# Patient Record
Sex: Male | Born: 1935 | Race: Black or African American | Hispanic: No | Smoking: Former smoker
Health system: Southern US, Community
[De-identification: ages and names within clinical notes are randomized; demographics above are authoritative.]

## PROBLEM LIST (undated history)

## (undated) DIAGNOSIS — K5900 Constipation, unspecified: Secondary | ICD-10-CM

## (undated) DIAGNOSIS — F3289 Other specified depressive episodes: Secondary | ICD-10-CM

## (undated) DIAGNOSIS — F0280 Dementia in other diseases classified elsewhere without behavioral disturbance: Secondary | ICD-10-CM

## (undated) DIAGNOSIS — S85909A Unspecified injury of unspecified blood vessel at lower leg level, unspecified leg, initial encounter: Secondary | ICD-10-CM

## (undated) DIAGNOSIS — E539 Vitamin B deficiency, unspecified: Secondary | ICD-10-CM

## (undated) DIAGNOSIS — G309 Alzheimer's disease, unspecified: Secondary | ICD-10-CM

## (undated) DIAGNOSIS — C61 Malignant neoplasm of prostate: Secondary | ICD-10-CM

## (undated) DIAGNOSIS — R339 Retention of urine, unspecified: Secondary | ICD-10-CM

## (undated) DIAGNOSIS — F329 Major depressive disorder, single episode, unspecified: Secondary | ICD-10-CM

## (undated) DIAGNOSIS — L8990 Pressure ulcer of unspecified site, unspecified stage: Secondary | ICD-10-CM

## (undated) DIAGNOSIS — I739 Peripheral vascular disease, unspecified: Secondary | ICD-10-CM

## (undated) DIAGNOSIS — D649 Anemia, unspecified: Secondary | ICD-10-CM

## (undated) DIAGNOSIS — J984 Other disorders of lung: Secondary | ICD-10-CM

## (undated) DIAGNOSIS — D696 Thrombocytopenia, unspecified: Secondary | ICD-10-CM

## (undated) DIAGNOSIS — IMO0002 Reserved for concepts with insufficient information to code with codable children: Secondary | ICD-10-CM

## (undated) DIAGNOSIS — N189 Chronic kidney disease, unspecified: Secondary | ICD-10-CM

## (undated) DIAGNOSIS — F0390 Unspecified dementia without behavioral disturbance: Secondary | ICD-10-CM

## (undated) DIAGNOSIS — H269 Unspecified cataract: Secondary | ICD-10-CM

## (undated) DIAGNOSIS — R634 Abnormal weight loss: Secondary | ICD-10-CM

## (undated) DIAGNOSIS — N529 Male erectile dysfunction, unspecified: Secondary | ICD-10-CM

## (undated) HISTORY — DX: Alzheimer's disease, unspecified: G30.9

## (undated) HISTORY — DX: Abnormal weight loss: R63.4

## (undated) HISTORY — DX: Other disorders of lung: J98.4

## (undated) HISTORY — DX: Male erectile dysfunction, unspecified: N52.9

## (undated) HISTORY — DX: Retention of urine, unspecified: R33.9

## (undated) HISTORY — DX: Unspecified cataract: H26.9

## (undated) HISTORY — DX: Malignant neoplasm of prostate: C61

## (undated) HISTORY — DX: Major depressive disorder, single episode, unspecified: F32.9

## (undated) HISTORY — DX: Reserved for concepts with insufficient information to code with codable children: IMO0002

## (undated) HISTORY — DX: Other specified depressive episodes: F32.89

## (undated) HISTORY — DX: Anemia, unspecified: D64.9

## (undated) HISTORY — DX: Thrombocytopenia, unspecified: D69.6

## (undated) HISTORY — DX: Unspecified injury of unspecified blood vessel at lower leg level, unspecified leg, initial encounter: S85.909A

## (undated) HISTORY — DX: Vitamin B deficiency, unspecified: E53.9

## (undated) HISTORY — DX: Constipation, unspecified: K59.00

## (undated) HISTORY — DX: Pressure ulcer of unspecified site, unspecified stage: L89.90

## (undated) HISTORY — PX: EYE SURGERY: SHX253

## (undated) HISTORY — DX: Chronic kidney disease, unspecified: N18.9

## (undated) HISTORY — DX: Unspecified dementia, unspecified severity, without behavioral disturbance, psychotic disturbance, mood disturbance, and anxiety: F03.90

## (undated) HISTORY — DX: Dementia in other diseases classified elsewhere, unspecified severity, without behavioral disturbance, psychotic disturbance, mood disturbance, and anxiety: F02.80

---

## 2000-05-25 ENCOUNTER — Inpatient Hospital Stay (HOSPITAL_COMMUNITY): Admission: EM | Admit: 2000-05-25 | Discharge: 2000-05-31 | Payer: Self-pay

## 2002-08-31 ENCOUNTER — Inpatient Hospital Stay (HOSPITAL_COMMUNITY): Admission: EM | Admit: 2002-08-31 | Discharge: 2002-09-07 | Payer: Self-pay | Admitting: Psychiatry

## 2003-09-15 ENCOUNTER — Emergency Department (HOSPITAL_COMMUNITY): Admission: EM | Admit: 2003-09-15 | Discharge: 2003-09-15 | Payer: Self-pay | Admitting: Emergency Medicine

## 2005-05-24 ENCOUNTER — Ambulatory Visit: Payer: Self-pay | Admitting: Internal Medicine

## 2005-05-24 ENCOUNTER — Inpatient Hospital Stay (HOSPITAL_COMMUNITY): Admission: EM | Admit: 2005-05-24 | Discharge: 2005-06-05 | Payer: Self-pay | Admitting: *Deleted

## 2005-06-20 ENCOUNTER — Ambulatory Visit: Payer: Self-pay | Admitting: Internal Medicine

## 2005-07-13 ENCOUNTER — Ambulatory Visit: Payer: Self-pay | Admitting: Internal Medicine

## 2006-09-26 ENCOUNTER — Emergency Department (HOSPITAL_COMMUNITY): Admission: EM | Admit: 2006-09-26 | Discharge: 2006-09-27 | Payer: Self-pay | Admitting: Emergency Medicine

## 2006-09-26 ENCOUNTER — Ambulatory Visit: Payer: Self-pay | Admitting: Psychiatry

## 2006-09-27 ENCOUNTER — Inpatient Hospital Stay (HOSPITAL_COMMUNITY): Admission: EM | Admit: 2006-09-27 | Discharge: 2006-09-28 | Payer: Self-pay | Admitting: Psychiatry

## 2007-02-20 ENCOUNTER — Inpatient Hospital Stay (HOSPITAL_COMMUNITY): Admission: EM | Admit: 2007-02-20 | Discharge: 2007-02-22 | Payer: Self-pay | Admitting: Emergency Medicine

## 2009-04-29 ENCOUNTER — Observation Stay (HOSPITAL_COMMUNITY): Admission: EM | Admit: 2009-04-29 | Discharge: 2009-05-04 | Payer: Self-pay | Admitting: Emergency Medicine

## 2010-04-14 ENCOUNTER — Ambulatory Visit (HOSPITAL_COMMUNITY): Admission: RE | Admit: 2010-04-14 | Discharge: 2010-04-14 | Payer: Self-pay | Admitting: Urology

## 2010-05-04 ENCOUNTER — Ambulatory Visit (HOSPITAL_BASED_OUTPATIENT_CLINIC_OR_DEPARTMENT_OTHER): Admission: RE | Admit: 2010-05-04 | Discharge: 2010-05-04 | Payer: Self-pay | Admitting: Urology

## 2010-12-17 DIAGNOSIS — I739 Peripheral vascular disease, unspecified: Secondary | ICD-10-CM

## 2010-12-17 HISTORY — DX: Peripheral vascular disease, unspecified: I73.9

## 2011-03-05 LAB — POCT I-STAT 4, (NA,K, GLUC, HGB,HCT)
Glucose, Bld: 86 mg/dL (ref 70–99)
HCT: 38 % — ABNORMAL LOW (ref 39.0–52.0)
Sodium: 138 mEq/L (ref 135–145)

## 2011-03-06 LAB — POCT I-STAT 4, (NA,K, GLUC, HGB,HCT): Glucose, Bld: 117 mg/dL — ABNORMAL HIGH (ref 70–99)

## 2011-03-24 ENCOUNTER — Emergency Department (HOSPITAL_COMMUNITY): Payer: Medicare Other

## 2011-03-24 ENCOUNTER — Emergency Department (HOSPITAL_COMMUNITY)
Admission: EM | Admit: 2011-03-24 | Discharge: 2011-03-24 | Disposition: A | Discharge status: Home / Self Care | Payer: Medicare Other | Attending: Emergency Medicine | Admitting: Emergency Medicine

## 2011-03-24 DIAGNOSIS — M25559 Pain in unspecified hip: Secondary | ICD-10-CM | POA: Insufficient documentation

## 2011-03-24 DIAGNOSIS — M6250 Muscle wasting and atrophy, not elsewhere classified, unspecified site: Secondary | ICD-10-CM | POA: Insufficient documentation

## 2011-03-24 DIAGNOSIS — Z8546 Personal history of malignant neoplasm of prostate: Secondary | ICD-10-CM | POA: Insufficient documentation

## 2011-03-24 DIAGNOSIS — F0390 Unspecified dementia without behavioral disturbance: Secondary | ICD-10-CM | POA: Insufficient documentation

## 2011-03-24 DIAGNOSIS — Z79899 Other long term (current) drug therapy: Secondary | ICD-10-CM | POA: Insufficient documentation

## 2011-03-24 DIAGNOSIS — M79609 Pain in unspecified limb: Secondary | ICD-10-CM | POA: Insufficient documentation

## 2011-03-24 LAB — BASIC METABOLIC PANEL
Calcium: 8.8 mg/dL (ref 8.4–10.5)
Chloride: 102 mEq/L (ref 96–112)
GFR calc non Af Amer: 56 mL/min — ABNORMAL LOW (ref >60)

## 2011-03-24 LAB — DIFFERENTIAL
Eosinophils Relative: 0 % (ref 0–5)
Lymphocytes Relative: 13 % (ref 12–46)
Lymphs Abs: 1.7 10*3/uL (ref 0.7–4.0)

## 2011-03-24 LAB — POCT CARDIAC MARKERS: Troponin i, poc: <0.05 ng/mL (ref 0.00–0.09)

## 2011-03-24 LAB — CBC
Hemoglobin: 11.8 g/dL — ABNORMAL LOW (ref 13.0–17.0)
MCV: 93.5 fL (ref 78.0–100.0)
Platelets: 132 10*3/uL — ABNORMAL LOW (ref 150–400)
RDW: 15.3 % (ref 11.5–15.5)

## 2011-03-27 LAB — COMPREHENSIVE METABOLIC PANEL
BUN: 24 mg/dL — ABNORMAL HIGH (ref 6–23)
Calcium: 9.1 mg/dL (ref 8.4–10.5)
Creatinine, Ser: 1.35 mg/dL (ref 0.4–1.5)
GFR calc Af Amer: >60 mL/min (ref >60)
Glucose, Bld: 81 mg/dL (ref 70–99)
Sodium: 134 mEq/L — ABNORMAL LOW (ref 135–145)
Total Bilirubin: 0.6 mg/dL (ref 0.3–1.2)

## 2011-03-27 LAB — DIFFERENTIAL
Basophils Relative: 0 % (ref 0–1)
Eosinophils Absolute: 0 10*3/uL (ref 0.0–0.7)
Eosinophils Relative: 1 % (ref 0–5)
Lymphs Abs: 1.3 10*3/uL (ref 0.7–4.0)
Monocytes Absolute: 0.3 10*3/uL (ref 0.1–1.0)
Neutrophils Relative %: 61 % (ref 43–77)

## 2011-03-27 LAB — CARBOXYHEMOGLOBIN
Methemoglobin: 1.1 % (ref 0.0–1.5)
O2 Saturation: 62.5 %
Total hemoglobin: 11.8 g/dL — ABNORMAL LOW (ref 13.5–18.0)

## 2011-03-27 LAB — CBC
HCT: 31.8 % — ABNORMAL LOW (ref 39.0–52.0)
Hemoglobin: 11 g/dL — ABNORMAL LOW (ref 13.0–17.0)
MCHC: 33.7 g/dL (ref 30.0–36.0)
MCHC: 33.9 g/dL (ref 30.0–36.0)
MCV: 96.2 fL (ref 78.0–100.0)
MCV: 96.9 fL (ref 78.0–100.0)
Platelets: 126 10*3/uL — ABNORMAL LOW (ref 150–400)
RDW: 14 % (ref 11.5–15.5)
RDW: 14.2 % (ref 11.5–15.5)

## 2011-03-27 LAB — BASIC METABOLIC PANEL
CO2: 26 mEq/L (ref 19–32)
Calcium: 8.8 mg/dL (ref 8.4–10.5)
Chloride: 110 mEq/L (ref 96–112)
GFR calc Af Amer: >60 mL/min (ref >60)
Glucose, Bld: 82 mg/dL (ref 70–99)
Potassium: 4.9 mEq/L (ref 3.5–5.1)

## 2011-03-27 LAB — URINE CULTURE: Colony Count: >=100000

## 2011-03-27 LAB — URINE MICROSCOPIC-ADD ON

## 2011-03-27 LAB — URINALYSIS, ROUTINE W REFLEX MICROSCOPIC
Bilirubin Urine: NEGATIVE
Glucose, UA: NEGATIVE mg/dL
Ketones, ur: 15 mg/dL — AB
Protein, ur: NEGATIVE mg/dL
pH: 6 (ref 5.0–8.0)

## 2011-03-27 LAB — TSH: TSH: 1.277 u[IU]/mL (ref 0.350–4.500)

## 2011-03-27 LAB — ETHANOL: Alcohol, Ethyl (B): <5 mg/dL (ref 0–10)

## 2011-03-27 LAB — VITAMIN B12: Vitamin B-12: 332 pg/mL (ref 211–911)
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
## 2011-05-04 NOTE — Discharge Summary (Signed)
NAME:  [name], [name] NO.:  [id number]   MEDICAL RECORD NO.:  [id number]                   PATIENT TYPE:  IPS   LOCATION:  0501                                 [facility]ACILITY[facility]  [facility]   PHYSICIAN:  Jeanice Lim, M.D.              DATE OF BIRTH:  21-Jan-1926   DATE OF ADMISSION:  08/31/2002  DATE OF DISCHARGE:  09/07/2002                                 DISCHARGE SUMMARY   IDENTIFYING DATA:  This is a 77 year old African-American male, single,  voluntarily admitted.  Had been drinking since being discharged from the [facility], reporting also using cocaine, up to $100 a day.  The patient had  been followed by Dr. Patsi Sears for prostate cancer in the past.   ADMISSION MEDICATIONS:  Lactaid, trazodone.   ALLERGIES:  No known drug allergies.   PHYSICAL EXAMINATION:  Essentially within normal limits, neurologically  nonfocal.   ROUTINE ADMISSION LABS:  MCV was 99.7, otherwise essentially within normal  limits, including liver function tests.   MENTAL STATUS EXAM:  Thin, muscular, generally healthy appearing African-  American male in no acute distress, fully alert.  Speech within normal  limits, mood euthymic, thought process goal directed, thought content  negative for dangerous ideations or psychotic symptoms.  Cognitively intact.  Judgment and insight fair.   ADMISSION DIAGNOSES:   AXIS I:  1. Substance-induced mood disorder.  2. Alcohol dependence.  3. Cocaine abuse.  4. Amphetamine abuse.   AXIS II:  None.   AXIS III:  Lactose intolerance, prostate cancer by history.   AXIS IV:  Moderate, limited support system.   AXIS V:  40/68.   HOSPITAL COURSE:  The patient was admitted and ordered routine p.r.n.  medications, underwent further monitoring, and was encouraged to participate  in individual, group and milieu therapy, including substance abuse therapy  and treatment.  The patient was placed on a low-dose Librium detox protocol  with  monitoring for safety, and patient participated in groups, showed  appropriate behavior on the unit, tolerated detox and medication changes as  well as clinical intervention without difficulty and reported a positive  response.   CONDITION ON DISCHARGE:  Markedly improved.  Mood was euthymic, affect  brighter, thought process goal directed.  Thought content negative for  dangerous ideation or psychotic symptoms.  The patient reported motivation  to be compliant with aftercare planning as well as remaining sober.   DISCHARGE MEDICATIONS:  Trazodone 50 mg q.h.s.   DISPOSITION:  He was to attend AA meetings and follow up in [place].   DISCHARGE DIAGNOSES:   AXIS I:  1. Substance-induced mood disorder.  2. Alcohol dependence.  3. Cocaine abuse.  4. Amphetamine abuse.   AXIS II:  None.   AXIS III:  Lactose intolerance, prostate cancer by history.   AXIS IV:  Moderate, limited support system.   AXIS V:  Global assessment of function on discharge was 55.  Jeanice Lim, M.D.    JEM/MEDQ  D:  10/27/2002  T:  10/27/2002  Job:  [id number]
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
## 2011-05-04 NOTE — Discharge Summary (Signed)
NAME:  VARIAN, INNES NO.:  [id number]   MEDICAL RECORD NO.:  [id number]          PATIENT TYPE:  INP   LOCATION:  3003                         FACILITY:  [facility]   PHYSICIAN:  C. Ulyess Mort, M.D.DATE OF BIRTH:  1926/05/10   DATE OF ADMISSION:  05/24/2005  DATE OF DISCHARGE:                                 DISCHARGE SUMMARY   ADDENDUM:  HYPERKALEMIA:  The patient had hyperkalemia; potassium level went  up to 5.4.  At this point, he was on lisinopril, also on Septra, both of  which could cause hyperkalemia.  Also, the patient had acute renal failure  with creatinine level going up to 1.6 at this point.  Due to multiple  factors, he could have related hyperkalemia.  For this reason, we  discontinued Septra, discontinued lisinopril, also monitored him closely,  gave him a dose of sodium Kayexalate 30 g x1; his potassium level came down  to 4.4 the next morning.  We need to monitor his BMET for a couple of more  days to follow up on his potassium level.  We also need to monitor his  creatinine level since acute renal failure was most likely secondary to  dehydration.  Need to encourage the patient to take a lot of fluids at this  time.       SY/MEDQ  D:  06/05/2005  T:  06/05/2005  Job:  [id number]

## 2011-05-04 NOTE — H&P (Signed)
NAME:  [name], [name] NO.:  [id number]   MEDICAL RECORD NO.:  [id number]          PATIENT TYPE:  EMS   LOCATION:  ED                           FACILITY:  [facility]   PHYSICIAN:  Lonia Blood, M.D.      DATE OF BIRTH:  [birth year]   DATE OF ADMISSION:  02/19/2007  DATE OF DISCHARGE:                              HISTORY & PHYSICAL   PRIMARY CARE PHYSICIAN:  Dr. Tildon Husky at the [facility].   PRESENTING COMPLAINT:  Altered mental status.   HISTORY OF PRESENT ILLNESS:  The patient is a 77 year old gentleman with  known history of dementia and alcoholism as well as other substance  abuse who presented with altered mental status.  The patient apparently  has been drinking, and he could not quantify how long.  He is a poor  historian, and no history is currently available.  Also, no family  available. The patient is unable to describe any other thing.  He was  seen in the ER with an alcohol level of 300.  Otherwise, no other  history is available.   PAST MEDICAL HISTORY FROM E-CHART:  1. Prior alcohol intoxication.  2. History of B12 deficiency, with some anemia.  3. History of major depressive disorder.  4. History of cocaine abuse.  5. History of prostate cancer, status post surgery back in 1994.  6. History of lactose intolerance.  7. History of acute renal failure secondary to dehydration and ACE      inhibitor.  8. History of hypertension.  9. Dementia, most likely metabolic.  10.History of urinary tract infection with Klebsiella before.   ALLERGIES:  NO KNOWN DRUG ALLERGIES.   MEDICATIONS:  Unsure of what the patient is taking at the moment.   SOCIAL HISTORY:  Again, the patient is unable to give adequate history.  However, from e-chart, seemed to be a former smoker, known heavy  drinker.  He lives alone.   FAMILY HISTORY:  Also not obtainable.   PHYSICAL EXAMINATION:  VITAL SIGNS:  Temperature 97.2, blood pressure  80/54, pulse 66, respiratory rate  16, saturations 100% on room air.  GENERAL:  He is awake, but not alert or oriented.  He is lethargic.  HEENT:  PERRL.  EOMI.  No visible jaundice.  No pallor.  NECK:  Supple.  No JVD, no lymphadenopathy.  RESPIRATORY:  There is good air entry bilaterally.  No wheezes, no  rales.  CARDIOVASCULAR:  He has S1, S2, no murmurs.  His abdomen is soft,  nontender, with positive bowel sounds.  EXTREMITIES:  Show no edema, cyanosis, or clubbing.   LABORATORIES:  So far showed alcohol level initially of 300, currently  down to 200.  Sodium 139, potassium 4.1, chloride 105, CO2 24, glucose  77, BUN 9, creatinine 1.17, calcium 8.8.  Head CT is currently pending.  EKG is pending.   ASSESSMENT:  Therefore, this is a 77 year old gentleman admitted with  alcohol intoxication.  The patient has had inpatient behavioral health  admission before for the same problem.  The patient is therefore  currently being admitted for alcohol intoxication at  this point.   PLAN:  1. Alcohol intoxication. The patient's altered mental status shows      that it is secondary to severe alcohol intoxication. However, his      vitals are now stable. Initially, he was hypotensive, but currently      blood pressure 108/54, so we will send him to regular floor with      falls and seizure precaution.  I will start him on thiamine and      folate IV.  Also follow with some Ativan as needed.  I will watch      him for DT.  2. History of B12 deficiency.  I will check his B12 level.  If it is      still low, we will resume B12 injections.  3. History of depressive disorder. Not clear what the patient is      taking. Maybe when he is sober or when family members show up, we      will find out what he is taking and resume it.  4. Prior history of substance abuse.  I will check urine drug screen      as part of his complete workup.  Other medical problems seem to be      is currently stable, but we will wait for the patient to be  more      sober and then determine if there are other issues that we need to      address.      Lonia Blood, M.D.  Electronically Signed     LG/MEDQ  D:  02/20/2007  T:  02/20/2007  Job:  [id number]

## 2011-05-04 NOTE — Discharge Summary (Signed)
NAME:  [name] NO.:  [id number]   MEDICAL RECORD NO.:  [id number]          PATIENT TYPE:  INP   LOCATION:  3003                         FACILITY:  [facility]   PHYSICIAN:  C. Ulyess Mort, M.D.DATE OF BIRTH:  Aug 09, 1926   DATE OF ADMISSION:  05/24/2005  DATE OF DISCHARGE:  05/30/2005                                 DISCHARGE SUMMARY   PHYSICIANS:  Family care physician: [facility], [place]   DISCHARGE DIAGNOSES:  1.  Altered mental status with delusions secondary to multifactorial      including B12 insufficiency, thiamine deficiency secondary to alcohol      induced and dehydration and urinary tract infection.  2.  B12 deficiency.  3.  Anemia secondary to B12 deficiency.  4.  Major depressive disorder.  5.  Alcohol dependence.  6.  History of crack cocaine abuse.  7.  Urinary tract infection with Klebsiella pneumonia.  8.  Acute renal failure secondary to dehydration, question ACE inhibitor      induced.  9.  LACTOSE intolerance.  10. History of prostate cancer status post surgery in 1994.  11. History of depression.  12. History of acetaminophen abuse.  13. History of hernia.  14. History of multiple admission for detoxification secondary to alcohol      abuse.  Last admission February 2005 at [facility] in [place].   DISCHARGE MEDICATIONS:  1.  Thiamine 100 mg p.o. daily.  2.  Protonix 40 mg p.o. daily.  3.  Bactrim 1 tablet p.o. b.i.d. x 2 days, last done June 16.  4.  Lactate tablet p.o. t.i.d. q.a.c.  5.  Bupropion 150 mg p.o. b.i.d.  6.  Terazosin 2 mg p.o. q.h.s.  7.  Aricept 5 mg p.o. q.h.s.  8.  Klonopin 1 mg p.o. daily.  9.  Reglan 10 mg p.o. q.a.c., q.h.s.  10. Imodium p.r.n. diarrhea.  11. Multivitamins 1 daily.  12. HCTZ held for dehydration secondary to acute renal insufficiency with      acute dehydration.  13. B12 1000 mcg monthly.   DISPOSITION:  The patient will be transferred to [facility] for further  inpatient  alcohol rehab program.  Francisco would be a voluntary admission.  In  the [facility], need to follow up:  1.  Creatinine level, BMET.  Encourage patient to drink a lot of fluids.  2.  He also needs monthly B12 shots of 1000 mcg.  3.  Continue to monitor carefully for abstinence from alcohol and follow up      on his mental status post hospitalization.   CONSULTATIONS:  Psychiatry, Antonietta Breach, M.D.   PROCEDURES:  1.  CT scan of the head was done at the time of admission which was negative      for intracranial bleeding.  2.  MRI of the brain was done which showed atrophy and small vessel disease.      No acute stroke.  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  Done on 25 May 2005.   HISTORY OF PRESENT ILLNESS:  [name] is a 77 year old African-  American male with past medical history significant for alcohol abuse,  opiate use, and acetaminophen abuse who was brought to the emergency  department by EMS after staff at the [facility] where he resides found  him wandering the hallway naked and trying to climb into the trash shoot.  At that time, he was oriented to person and place only.  According to his  sister, [name] is typically oriented x 4, participates in church social  functions, lives alone, and takes care of himself.  He has evidentially had  problems with declining functional status over the past six months and has  been noticing loss of memory, especially recent and also has been confused  and not even to take care of his for the past six months.   He was last admitted for alcohol abuse at the [facility] November 2003.   PHYSICAL EXAMINATION:  VITAL SIGNS:  Admission vital signs: Pulse 69, blood  pressure 80/44, recheck 111/60, temperature 97.4, respiratory rate 16, O2  saturation 98% on room air.  On examination, the patient was cachectic, slow  to answer questions, follows  one-step commands.  Eyes: Arcus senilis.  Pupils 4 mm bilaterally.  Cataracts bilaterally, minimally reacting to  light.  ENT atraumatic.  Vital signs negative.  Poor dentition.  NECK:  No JVD. No carotid bruit.  LUNGS:  Clear to auscultation bilaterally.  CARDIOVASCULAR:  Regular rate and rhythm.  No murmurs, gallops, or rubs  appreciated.  ABDOMEN:  Soft, hepatomegaly 3 cm below the ribs, nontender.  EXTREMITIES:  No tremor, no edema.  Cool to touch.  No asterixis.  RECTAL:  Poor rectal tone.  Brown stool hemoccult negative.  SKIN:  Dry, fair turgor.  No spider angioma, no jaundice.  NEUROLOGIC:  Motor 5/5 strength throughout.  Oriented to person, place only.  Slight facial droop on the left.  Extraocular movements intact.  No  nystagmus.  Tongue midline.  Deep tendon reflexes decreased.  Babinski  downgoing.  Decreased vibratory sensation, finger-to-nose abnormal.  No  clonus.   ADMISSION LABORATORY DATA:  CBC: Hemoglobin 10.2, WBC 4.9, thrombocytes 194.  BMET: Sodium 138, potassium 3.8, chloride 109, bicarb 22, BUN 18, creatinine  1.6.  Baseline creatinine 1.1, glucose 99, anion gap 7, bilirubin 0.25,  __________ ALT 9, protein 5.6, albumin 3.2, calcium 8.6, phosphorus 390,  magnesium 2.3.  Point-of-care markers negative x 2.  CK 184, CK-MB 6.4,  troponin I 0.01.   CT showed atrophy, no bleeding.   EKG: Normal sinus rhythm at 67 beats per minute, diffusely flat T waves.  Marginal ST elevations in V2, V3. Right bundle branch block.  Poor R wave  progression.  Borderline QRS 120 msec.  Borderline QT prolongation.   UA showed a pH of 6, nitrites negative, leukocyte esterase positive, wbc's  11 to 20, many bacteria.  Urine drug screen positive for benzodiazepines.  Alcohol level was 0.5.  CK 185, MB 6.4, troponin I 0.01.  Point-of-care  markers x 2 negative.   HOSPITAL COURSE:  PROBLEM #1. ALTERED MENTAL STATUS:  Secondary to delirium induced from possible dehydration, thiamine  insufficiency secondary to  alcoholism, also accompanying B12 deficiency.  At the time of the admission  given his age and being an elderly Perry, differential diagnoses were  broad with history of alcohol abuse and his gradual diminution of memory and  confusion these past few months according to  his sister, possibly  differential would be encephalopathy secondary to thiamine deficiency.  Started treatment with thiamine and folate.  Checked his B12 level which was  low.  Treated him with B12 single dose of 1000 mcg with repeat after one  month.  Also checked RPR level given the subacute nature which was  nonreactive.  Also did thyroid level which was within normal limits.  Given  his age and risk factors, possible TIA versus CVA were in differential for  [name] and given MRI which was negative for acute or chronic stroke.  He did not have any fever.  Leukocyte count was within normal limits.  Unlikely sepsis or meningitis, did not even have fever, photophobia, or neck  stiffness.  He had a chest x-ray which was negative for infiltrates.  Urine  was positive for leukocyte esterase.  Started treated with Septra.  Checked  urine for drug screen which was positive for benzodiazepines.  Check for [facility]  was negative.  Checked acetaminophen level, alcohol level which was within  normal limits.  Checked liver function tests to rule out encephalopathy  which were within normal limits.  Checked ammonia level which was also  normal.   PROBLEM #2.  B12 DEFICIENCY:  Treated him with a single dose of 1000 mcg,  repeat monthly 1000 mcg.  Follow Francisco clinically for improvement of  B12  level.  Also checked thiamine level which was slightly high and also methyl  melanoic acid which was within normal limits.  B12 deficiency could not  explain his current symptoms, and methyl melanoic acid level being normal.  Neurologic involvement with underlying B12 deficiency was unlikely.   PROBLEM #3.   MAJOR DEPRESSIVE DISORDER:  According to patient's sister, Mr.  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  Worked as a Forensic scientist custodian later on and since  past years has been in a lot of stress secondary to his social situation at  home.  Has two children who stay out of the town and sister stays nearby in  [place] and visits him occasionally.  She also mentions he has been  drinking quite a lot these past few months and being very confused and  withdrawing from life.  For Francisco, made a psychiatry consult.  Dr. Jeanie Sewer  saw the patient and recommended to continue him on his home medications of  Wellbutrin XR 150 b.i.d.  He is also on Klonopin 1 mg daily.  Also at the  same time, treated for thiamine and B12 deficiency.  At Francisco time, since the  patient needs further monitoring and also detoxification for alcohol abuse, he will be transferred to the [facility] in [place] where he gets his usual  continuity care for further monitoring and care.   PROBLEM #4.  HISTORY OF ALCOHOL ABUSE:  I started him on thiamine and  folate.  Checked his magnesium, repeated, watched him carefully for DTs.  He  did not have any symptoms of DTs or withdrawal during his hospital stay.  Again, he is going to be transferred to the [facility] for further  detoxification.   PROBLEM #5.  HISTORY OF COCAINE ABUSE:  Again, the patient is to go to  detoxification and will be transferred for inpatient detoxification  rehabilitation program.   PROBLEM #6.  HYPOTENSION:  Most likely secondary to dehydration.  The  patient was dehydrated mostly secondary to poor p.o. intake and also  significant  alcohol intake over the past few days and confusion.  Treated  him with IV normal saline bolus and maintain p.o. fluids.  His creatinine  level came down to 1.1 the next day.   PROBLEM #7.  ACUTE RENAL FAILURE SECONDARY TO DEHYDRATION:  Treated him with  fluids.   His creatinine level came down to 1.1.  Later on discontinued IV  fluids and put him on p.o. After we started him on his home medications  Lisinopril 5 mg daily, his creatinine was slightly bumped up to 1.4 the  second day after starting Lisinopril.  The patient has renal artery  stenosis.  At Francisco time, will hold Lisinopril and monitor him carefully with  rechallenge on Lisinopril and continue to monitor creatinine level.   PROBLEM #8.  MARGINAL ELECTROCARDIOGRAM CHANGES:  Since did not have any old  EKG to compare, cardiac enzymes which were negative, will monitor him on  telemetry.  The patient did not have any symptoms or changes throughout his  hospital stay.   PROBLEM #9.  POSITIVE BENZODIAZEPINES IN URINE:  Maintain him on low-dose  Ativan for avoiding possible benzodiazepine withdrawal.  Later put him on  his home medications of Clonazepam 2 mg daily and reduced the dose to 1 mg  daily.   PROBLEM #10.  MEMORY LOSS:  Could be multifactorial including  Alzhiemers  versus age related.  Finally started him on Aricept during his hospital  stay.   DISCHARGE LABORATORY DATA:  BMET: Sodium 138, potassium 4.5, chloride 106,  BUN 17, bicarb 25, creatinine 1.4.  Blood pressure 106/59, heart rate 66,  respiratory rate 22, temperature 97.  Hemoglobin 9.8, WBC 4.     ______________________________  Judie Grieve, MD    ______________________________  C. Ulyess Mort, M.D.    SY/MEDQ  D:  05/30/2005  T:  05/30/2005  Job:  [id number]   cc:   [facility], [facility], [place]

## 2011-05-04 NOTE — H&P (Signed)
[facility]  Patient:    [name], [name]                      MRN: [id number] Adm. Date:  05/25/00 Attending:  Warnell Bureau, M.D. Dictator:   Eduard Roux, NP                   Psychiatric Admission Assessment  IDENTIFYING INFORMATION:  Patient is a 77 year old single African-American male voluntarily admitted secondary to alcohol abuse and dependency and he is requesting detox from alcohol at this time.  HISTORY OF PRESENT ILLNESS:  Patient reports drinking 2-4 40-ounce beers a day plus one bottle of wine everyday to every other day.  Patient also has been smoking crack cocaine on the weekends.  He has no history of DTs or seizures with alcohol cessation.  He is reporting intermittent depression and has recently gone to [facility] for treatment of this.  He is reporting night sweats, decreased sleep, decreased energy and has experienced no appetite and has lost approximately 40 pounds over the past three years. He is denying suicidal ideation and homicidal ideation.  He reportedly thought he was "hearing things" prior to admission.  He denies that at present.  He has his longest period of sobriety from 70 to 1998.  Patient, additionally, fell on 05/24/00 while drinking and received no significant injury.  Patient appears motivated for detox at this time.  PAST PSYCHIATRIC HISTORY:  He has had Willy Eddy in 1970 for alcohol dependency.  He was in [place] inpatient treatment in 1990 for alcohol dependency.  He has been a client of [facility] x 4 months for depression.  He has had previous psychotropic medication trials of Elavil with unknown results and Wellbutrin with positive results.  No other medications could be recalled at this time.  SOCIAL HISTORY:  He is single.  He has two children.  One son who lives in [place] and one daughter who lives in [place].  He has a sister and aunts that live in  [place].  He lives alone at present.  FAMILY HISTORY:  He has an aunt that is schizophrenic.  His mother was an alcoholic and his uncle was an alcoholic.  ALCOHOL AND DRUG HISTORY:  As noted in history of present illness and additionally he has been smoking crack cocaine on the weekends.  MEDICAL PROBLEMS:  He has a history of prostate cancer with surgical intervention in 1994; hernia repair in 1988 and 1990.  MEDICATIONS: 1. Hytrin questionably 5-10 mg q.h.s. 2. Trazodone of unknown dose. 3. Sudafed.  DRUG ALLERGIES:  No known allergies.  PHYSICAL EXAMINATION:  Performed at [facility] Room without significant findings.  He received folic acid and thiamine while in the emergency room.  His blood alcohol level was 64.  His CBC revealed decreased RBCs at 3.94, increased MCV at 101.1.  CMET with decreased STOT at 26, decreased SGPT at 12.  Urine drug screen was negative.  MENTAL STATUS EXAMINATION:  Appearance and behavior disheveled, older African-American male.  He is very polite and cooperative.  His speech is normal rate and tone and relevant.  His mood is depressed.  His affect is somewhat constricted.  Thought processes were logical and coherent.  Patient is able to stay on track.  There is no evidence of psychosis.  As per history of present illness, patient was wondering whether he was experiencing auditory hallucinations.  He is adamantly denying  any auditory, visual or tactile hallucinations at present.  He is negative for suicidal or homicidal ideation. His cognitive function appears to be intact.  He is oriented x 4.  Adequate insight and judgment.  CURRENT DIAGNOSES: Axis I:    1. Substance-induced mood disorder.            2. Alcohol dependence.            3. Cocaine abuse. Axis II:   None. Axis III:  1. Prostate cancer.            2. History of hernia repair. Axis IV:   Moderate related to problems with primary support group. Axis V:    Current GAF  30; highest past year 60.  TREATMENT PLAN AND RECOMMENDATIONS:  We will voluntarily admit patient to [facility] for stabilization and detox from alcohol. Fifteen-minute checks for safety will be provided as all fall precautions. Phenobarbital protocol will be initiated to treat signs and symptoms of withdrawal.  We will initiate Hytrin 5 mg q.h.s., trazodone 50 mg q.h.s. p.r.n.  After patient stabilizes from detox, we will evaluate the role of antidepressants prior to discharge.  Pending laboratory values at the time of this dictation include TSH, T4 and T3.  TENTATIVE LENGTH OF STAY AND DISCHARGE PLANS:  Two to three days with follow-up at [facility]. DD:  05/26/00 TD:  05/26/00 Job: [id number]

## 2011-05-04 NOTE — Discharge Summary (Signed)
NAME:  [name], [name] NO.:  [id number]   MEDICAL RECORD NO.:  [id number]          PATIENT TYPE:  INP   LOCATION:  3003                         FACILITY:  [facility]   PHYSICIAN:  C. Ulyess Mort, M.D.DATE OF BIRTH:  Oct 01, 1926   DATE OF ADMISSION:  05/24/2005  DATE OF DISCHARGE:  05/30/2005                                 DISCHARGE SUMMARY   PRIMARY CARE PHYSICIAN:  [facility], [place], [place]   DISCHARGE DIAGNOSES:  1.  Altered mental status secondary to delirium secondary to B12 deficiency.   Dictation ends here.       SY/MEDQ  D:  05/30/2005  T:  05/30/2005  Job:  [id number]   cc:   [facility], [place], [facility], [place], [place]

## 2011-05-04 NOTE — Consult Note (Signed)
[name], KANADY NO.:  [id number]   MEDICAL RECORD NO.:  [id number]          PATIENT TYPE:  INP   LOCATION:  1415                         FACILITY:  [facility]   PHYSICIAN:  Antonietta Breach, M.D.  DATE OF BIRTH:  1926-09-30   DATE OF CONSULTATION:  02/21/2007  DATE OF DISCHARGE:  02/22/2007                                 CONSULTATION   REQUESTING PHYSICIAN:  Lonia Blood, M.D.   REASON FOR CONSULTATION:  Altered mental status as well as alcohol  dependence.   HISTORY OF PRESENT ILLNESS:  [name] is a 77 year old male  admitted to the [facility] on February 19, 2007 due to changes in  his mental status.  He does have a well-known history of alcoholism.  He  has resumed drinking; for how long he has been drinking alcohol again,  is not clear.  He has not been able to provide an adequate history.  When he presented to the emergency room his alcohol level was 300.   Currently the patient displays poor time orientation.  He also displays  memory dysfunction.  He is not currently displaying any alcohol  withdrawal signs.  He is cooperative with bedside care, and  noncombative.  His mood appears to be generally within normal limits.  He is not showing any thoughts of harming himself or others.  He is not  showing any hallucinations or delusions.  He has been placed on thiamine  100 mg daily, as well as folic acid 1 mg daily, and a multivitamin  daily.  He was placed on Ativan 1 mg daily q.8 h. p.r.n.; he has not  required any p.r.n. Ativan.   PAST PSYCHIATRIC HISTORY:  [name] has undergone at least three  psychiatric admissions, that have involved complications with alcohol  dependence.  His last known psychiatric admission was to the [facility] in October 2007.  He was in alcohol withdrawal  at that time; and he was treated with a Librium withdrawal protocol.  He  has a history of a mood disorder followed by the [facility].  He also  has a history of cocaine abuse.  He required admissions to the [facility] in June 2001 as well as the fall of 2003.   FAMILY PSYCHIATRIC HISTORY:  The patient reportedly has an aunt who is a  schizophrenic, according to the past medical record.  Also the past  medical record reports that the patient's mother was an alcoholic as  well as his uncle.   SOCIAL HISTORY:  [name] is single.  Occupation:  Retired.  He does  have two children; a daughter and a son.  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  Religion is Baptist.  He does not  have any local family currently, other than possibly a sister who lives  in [place].   PSYCHIATRIC HISTORY:  Mr. [name] has experienced intermittent  depression according to the past medical record.  He has undergone  treatment with Elavil in the past.   GENERAL MEDICAL PROBLEMS:  1. B12 deficiency.  2. Prostate cancer status post surgery in 1997.  3. Lactose intolerance.  4. Hypertension.  5. Dementia is also listed in the past medical record   ALLERGIES:  No known drug allergies.   MEDICATIONS:  The MAR is reviewed.  The patient is currently on Ativan 1  mg q.8 h. p.r.n.; and has not required any p.r.n. dosing of Ativan.  He  is also on a multivitamin daily, folic acid 1 mg daily and thiamine 100  mg daily.   LABORATORY DATA:  WBC 4.4, hemoglobin 11.9, platelet count 186.  INR  1.0.  Metabolic panel shows a BUN of 9, creatinine of 1.17, SGOT 24,  SGPT 12, calcium 8.6, albumin is within normal range at 3.5.  TSH within  normal limits at 0.63, B12 is 655 which is within normal limits. Tylenol  negative. Aspirin negative.  Urine drug screen unremarkable.  Alcohol  was 295 upon presentation to the emergency room.   HEAD CT:  Without contrast on March 6 showed generalized atrophy, no  acute intracranial abnormality.   REVIEW OF SYSTEMS:  The patient was not able to provide part  of this.  It was also taken from the medical record and staff.  CONSTITUTIONAL:  Afebrile.  HEAD:  No trauma.  EYES:  No visual changes.  EARS:  No  hearing impairment NOSE:  No rhinorrhea.  MOUTH/THROAT:  No sore throat.  NEUROLOGIC:  As above.  PSYCHIATRIC:  As above.  CARDIOVASCULAR:  No  chest pain, palpitations.  RESPIRATORY:  No coughing nor wheezing.  GASTROINTESTINAL:  No nausea, vomiting, diarrhea.  GENITOURINARY:  No  dysuria.  SKIN:  Unremarkable.  ENDOCRINE/METABOLIC:  As above.  MUSCULOSKELETAL:  No deformities.  HEMATOLOGIC/LYMPHATIC:  Anemia.   PHYSICAL EXAMINATION:  VITAL SIGNS:  Temperature 98.0, pulse 68,  respiration 20, blood pressure 133/66, O2 saturation on room air 100%.  The patient demonstrates no withdrawal tremor or sweating.   MENTAL STATUS EXAM:  [name] is a polite, elderly male appearing his  chronologic age; partially reclined in a supine position in his hospital  bed, alert with good eye contact.  Psychomotor tone is generally within  normal limits.  On orientation testing he believes the year is 91.  He  does not know the month or the day of the month.  He does not know the  day of the week.  He does not know the place.  On memory testing 3/3  immediate, 0/3 at 3 minutes.  His thought process involves partial  confabulation.  Thought content:  No thoughts of harming himself, no  thoughts of harming others, no delusions, no hallucinations.  Fund of  knowledge and intelligence below that of his estimated premorbid  baseline.  His speech involves normal rate and prosody.  Concentration  is decreased as his memory does not allow him to stay on the subject.  Insight is poor.  Judgment is impaired.  Affect is broad and  appropriate.  Mood is within normal limits.   ASSESSMENT:  AXIS I:  1.  (Code 294.9) Unspecified persistent mental  disorder NOS.  The patient does not have a delirium currently.  He does exhibit signs of dementia.  It is unclear  whether this is reversible, or  partially reversible.  Given his level of albumin, it does not appear  that he was having severe malnutrition.  Also, it appears that his B12  level was maintained, therefore, the possibility of B1 deficiency in the  typical nutritional deficient alcoholic lifestyle is not high during  this admission.  However, there is the possibility that with continued  nutrition and B1 replacement that he could recover significant memory  and cognitive function.  1. He also may have a form of Korsakoff dementia or multifactorial      dementia.  2. (Code 293.83) Mood disorder not otherwise specified, depressed,      currently stable (possibly functional as well as general medical      etiologies (this category is utilized to designate the patient's      history of depression reported in the record.  Would not place the      patient on an antidepressant unless he developed depressive      symptoms.  At this point, the patient's history cannot confirm that      he meets the criteria for antidepression prevention.  AXIS II:  None  AXIS III:  See general medical problems.  AXIS IV:  General medical, primary support group.  AXIS V:  40.   RECOMMENDATIONS:  1. Would continue his nutritional supplementation including thiamine      100 mg q.a.m.  2. If his memory and cognitive function do not return, would ensure      that he has had an RPR checked and would consider Aricept and/or      Namenda.      Antonietta Breach, M.D.  Electronically Signed     JW/MEDQ  D:  02/23/2007  T:  02/23/2007  Job:  [id number]   cc:   Lonia Blood, M.D.

## 2011-05-04 NOTE — Discharge Summary (Signed)
[name], OSMENT NO.:  [id number]   MEDICAL RECORD NO.:  [id number]          PATIENT TYPE:  INP   LOCATION:  1415                         FACILITY:  [facility]   PHYSICIAN:  Hettie Holstein, D.O.    DATE OF BIRTH:  [birth year]   DATE OF ADMISSION:  02/19/2007  DATE OF DISCHARGE:  02/22/2007                               DISCHARGE SUMMARY   PRINCIPAL DIAGNOSIS:  Alcohol dependency presenting with acute  intoxication.   SECONDARY DIAGNOSIS:  1. B12 deficiency.  2. Depression.  3. History of cocaine abuse.  4. History of prostate cancer.  5. Resolved hypotension.  6. Reported lactose intolerance.   MEDICATIONS ON DISCHARGE:  Valium 5 mg t.i.d. x2 days followed by b.i.d.  x2 days followed by daily x2 days.   FOLLOW UP:  Instructed to follow-up with primary physician following  discharge.  He is felt to be medically stable for discharge.   He underwent physical therapy, occupational therapy evaluation with  baseline functional status and mobilization using a cane.  Home health  physical therapy recommendations were made for environmental assessment  for fall prevention after discharge and meals on wheels.  Information  was provided to patient and family.  Home health occupational therapy on  follow-up was recommended as well.  These were arranged.  The patient  was seen by Dr. Jeanie Sewer of psychiatry in consultation with  recommendations for continuation of nutritional supplementation  including Thiamine.  His laboratory data revealed a B12 level of 6/55.  His acetaminophen and salicylate levels were within normal range.  His  lipid profile revealed an LDL of 92, total cholesterol 179, HDL 73.  Triglycerides 71.  TSH was 0.633.  PT was 1.0.  Hepatic function panel  was within normal range.  His alcohol level was 200 mm/dc.   He is felt to be medically stable for discharge.      Hettie Holstein, D.O.  Electronically Signed     ESS/MEDQ  D:  03/27/2007  T:   03/27/2007  Job:  [id number]

## 2011-05-04 NOTE — H&P (Signed)
NAME:  [name], [name] NO.:  [id number]   MEDICAL RECORD NO.:  [id number]                   PATIENT TYPE:  IPS   LOCATION:  0501                                 [facility]  [facility]   PHYSICIAN:  Jeanice Lim, MD                DATE OF BIRTH:  1926-04-20   DATE OF ADMISSION:  08/31/2002  DATE OF DISCHARGE:                         PSYCHIATRIC ADMISSION ASSESSMENT   IDENTIFYING INFORMATION:  This is a 77 year old African-American male who is  single.  He is a voluntary admission.  Goes by the name of [name].   HISTORY OF PRESENT ILLNESS:  This patient arrived at the emergency room  accompanied by his sister requesting detox and reporting blackouts and  depressed mood with his alcohol use.  He reports that he relapsed  immediately after being discharged from the [facility] where he spent five weeks in either January or  February of this past year and he reports he has been drinking since that  time in escalating fashion.  He has now been drinking up to five 40-ounce  Cobra beers per day along with some whiskey, almost every day.  He also had  been introduced to cocaine about six years ago by his younger friends that  he drinks with and tends to use cocaine when he is drinking.  The most he  ever uses is about $100 a day.  He reports that his biggest problem is the  need to change his friends, that after he gets detoxed and they knock on his  door and he starts drinking again with them.  He denies any specific other  life stressors.  He denies any homicidal ideation or suicidal ideation  today.  He denies any auditory or visual hallucinations and he admits that  he has been quite depressed when he drinks and he is also having problems  with blackouts, although he denies that he has ever had any suicidal  ideation or thoughts of harming anyone else.  The patient reports that he  sleeps well if he uses his trazodone and that he  blames the alcohol for his  mood.  The patient's urine drug screen was also positive for amphetamines  but he is at a loss to explain this.   PAST PSYCHIATRIC HISTORY:  The patient has been followed at the [facility],  who has prescribed his medications.  He is followed there both for his  medical problems and for his mood disorder.  This is his second admission to  [facility] with his last one in 2001, also for  treatment of alcohol abuse and cocaine abuse.  The patient denies any  suicidal ideation in the past or any attempt.  He reports his longest period  sober was approximately 20 years in the distant past with the help of  Alcoholics Anonymous.  He began using alcohol at age 5 but reports he did  not really have problems with it until his 53s.  The patient does have a  history of other detoxes in the distant past including admissions to  hospitals in [place].   SOCIAL HISTORY:  This is a 77 year old African-American male who is single.  He has two children, one son in [place], one daughter who lives in [place].  He has one supportive sibling who is still living here in [place], his  sister who is helpful to him and who took him to the emergency room  yesterday.  He was in the [place] War.  He currently lives at [facility] in [place].   FAMILY HISTORY:  The patient denies any family history of substance abuse or  mental health problems.   ALCOHOL/DRUG HISTORY:  As noted above.   MEDICAL HISTORY:  The patient has been followed in the distant past by Dr.  Patsi Sears for prostate cancer but more recently by the [facility].  He had  surgical intervention for this in 1994 and no treatment since that time.  He  apparently does have a possible distant history of hypertension but nothing  current.  Denies taking any medications.  Currently, he does have lactose  intolerance.   MEDICATIONS:  LactAid tablets, which he takes to prevent getting  diarrhea  when he wants to drink milk products.  The patient also takes trazodone 50  mg apparently q.h.s.  He is somewhat unclear on the dose.  He takes this at  night to help him sleep.   ALLERGIES:  None.   POSITIVE PHYSICAL FINDINGS:  The patient's physical examination was done in  the emergency room.  Vital signs, on admission to the unit, were temperature  96.7, pulse 72, respirations 16, blood pressure 122/66.  He is 5 feet 9  inches tall and weighs 145 pounds.  He reports his baseline weight of 160  pounds but reports that he is unable to eat when he is using cocaine and he  completely loses his appetite.   LABORATORY DATA:  His pulse oximetry there was 99%.  His alcohol level was  less than 5.  His urine drug screen was positive for cocaine and  amphetamines.  His CBC shows RBC is mildly decreased at 3.60.  His  hemoglobin is at 12.3 and his hematocrit at 35.8.  His MCV is 99.7%.  Metabolic panel revealed electrolytes within normal limits.  His BUN is 19,  creatinine 1.1.  Liver enzymes within normal limits with a total bilirubin  of 0.5.  His SGOT is 22.  His SGPT is 13.  His thyroid panel is currently  pending.   MENTAL STATUS EXAM:  This is a thin, muscular, generally healthy-appearing  African-American male who is in no acute distress.  He is fully alert with a  bright affect, congenial and cooperative.  Speech is normal and relevant.  Mood is euthymic.  Thought process is logical without deficits.  His insight  is very good.  He seems to have good resolve to quit and has formulated a  strategy and is aware of his relapse triggers.  Cognitively, he is intact  and oriented x 3.   DIAGNOSES:   AXIS I:  1. Substance-induced mood disorder.  2. Alcohol abuse; rule out dependence.  3. Cocaine abuse; rule out dependence.  4. Amphetamine abuse.   AXIS II:  None.   AXIS III:  1. Lactose intolerance.  2. Prostate cancer by history. 3. Anemia not otherwise specified.    AXIS  IV:  Deferred.   AXIS V:  Current 40; past year 11.   PLAN:  Voluntarily admit the patient to detox him with a goal of a safe  detox within five days and to evaluate him for possible major depression  based on his mood change and to rule out substance-induced mood disorder.  We will place him on a low dose Librium protocol which, so far, he is  tolerating well and enroll him immediately in intensive group and individual  psychotherapy.  Meanwhile, we will give him trazodone 50 mg p.o. q.h.s. and  will refer him back to the [facility] at discharge for  additional follow-up and getting him hooked up with AA in here to renew his  resolve to stay sober.  Meanwhile, we are going to do anemia studies on him  based  on his high normal mean corpuscular volume and his low hemoglobin and  hematocrit and we will restart his LactAid.   ESTIMATED LENGTH OF STAY:  Five days.     Margaret A. Stephannie Peters                   Jeanice Lim, MD    MAS/MEDQ  D:  09/01/2002  T:  09/01/2002  Job:  [id number]

## 2011-05-04 NOTE — Discharge Summary (Signed)
[name], BOISCLAIR NO.:  [id number]   MEDICAL RECORD NO.:  [id number]          PATIENT TYPE:  IPS   LOCATION:  0307                          [facility]ACILITY[facility]  [facility]   PHYSICIAN:  Anselm Jungling, MD  DATE OF BIRTH:  06/09/1926   DATE OF ADMISSION:  09/27/2006  DATE OF DISCHARGE:  09/28/2006                                 DISCHARGE SUMMARY   IDENTIFYING DATA AND REASON FOR ADMISSION:  The patient is a 77 year old  African American male who presented for treatment of alcohol dependence.  His last drink was 2 days prior to admission.  He had been drinking probably  no more than a 40 ounces beer daily.  He denied withdrawal symptoms upon  admission, but had been given low-dose Librium to alleviate alcohol  cessation symptoms prior to the initial MD assessment.  He had previous  courses of detoxification for alcoholism here in 2004.  Please refer to the  admission note for further details pertaining to the symptoms, circumstances  and history that led to his hospitalization.   The patient also had a history of dementia.  He was given initial Axis I  diagnoses of alcohol dependence, rule out mood disorder NOS, and dementia  NOS.   MEDICAL AND LABORATORY:  The patient was medically and physically assessed  by the psychiatric nurse practitioner.  He presented as [place] slender, but  healthy-appearing well-organized gentleman who walked with a cane, and was  hard of hearing.  Otherwise, he appeared to be in generally good health  without any  chronic medical problems.  There were no significant medical  issues.   HOSPITAL COURSE:  The patient was placed on a Librium withdrawal protocol,  but he had very minimal alcohol withdrawal symptoms, and after his  admission, we learned that his alcohol consumption level had not necessarily  been high enough to produce a true withdrawal syndrome.  He was pleasant,  cooperative, and although he had some memory difficulties, indicated in  a  sincere fashion that he did want to stop drinking.  He did attend  therapeutic groups and activities including AA meetings, and indicated that  he was interested in continuing with AA and getting a sponsor.   We identified the fact that the patient was to have an appointment at the  [facility] on Monday, 09/30/2006, for assessment for  services related to his dementia diagnosis.  It was determined that he could  also explore the possibility of further treatment for alcohol recovery at  that place and time.   He was felt to be appropriate for discharge on 09/28/2006, the third  hospital day.   AFTERCARE:  The patient was to follow-up on 09/30/2006 at the [facility] in [place] for further services including those related to his  cognitive decline and his alcohol dependence.  He was discharged on no  routine medications.  He was to return to the home of his family.   DISCHARGE DIAGNOSES:  AXIS I: Dementia, not otherwise specified, alcohol  abuse, not otherwise specified.  AXIS II: Deferred.  AXIS III: No acute or  chronic illnesses.  AXIS IV: Stressors severe.  AXIS V: Global assessment of functioning on discharge 50.      Anselm Jungling, MD  Electronically Signed     SPB/MEDQ  D:  09/28/2006  T:  09/29/2006  Job:  [id number]

## 2011-06-05 ENCOUNTER — Encounter (HOSPITAL_BASED_OUTPATIENT_CLINIC_OR_DEPARTMENT_OTHER): Payer: Medicare Other | Attending: General Surgery

## 2011-06-05 DIAGNOSIS — L97409 Non-pressure chronic ulcer of unspecified heel and midfoot with unspecified severity: Secondary | ICD-10-CM | POA: Insufficient documentation

## 2011-06-05 DIAGNOSIS — Z79899 Other long term (current) drug therapy: Secondary | ICD-10-CM | POA: Insufficient documentation

## 2011-06-05 DIAGNOSIS — J449 Chronic obstructive pulmonary disease, unspecified: Secondary | ICD-10-CM | POA: Insufficient documentation

## 2011-06-05 DIAGNOSIS — J4489 Other specified chronic obstructive pulmonary disease: Secondary | ICD-10-CM | POA: Insufficient documentation

## 2011-06-05 DIAGNOSIS — L97309 Non-pressure chronic ulcer of unspecified ankle with unspecified severity: Secondary | ICD-10-CM | POA: Insufficient documentation

## 2011-06-05 DIAGNOSIS — C61 Malignant neoplasm of prostate: Secondary | ICD-10-CM | POA: Insufficient documentation

## 2011-06-12 ENCOUNTER — Encounter (INDEPENDENT_AMBULATORY_CARE_PROVIDER_SITE_OTHER): Payer: Medicare Other

## 2011-06-12 DIAGNOSIS — I739 Peripheral vascular disease, unspecified: Secondary | ICD-10-CM

## 2011-06-26 ENCOUNTER — Encounter (HOSPITAL_BASED_OUTPATIENT_CLINIC_OR_DEPARTMENT_OTHER): Payer: Medicare Other | Attending: General Surgery

## 2011-06-26 ENCOUNTER — Ambulatory Visit (HOSPITAL_COMMUNITY)
Admission: RE | Admit: 2011-06-26 | Discharge: 2011-06-26 | Disposition: A | Discharge status: Home / Self Care | Payer: Medicare Other | Source: Ambulatory Visit | Attending: General Surgery | Admitting: General Surgery

## 2011-06-26 ENCOUNTER — Other Ambulatory Visit (HOSPITAL_BASED_OUTPATIENT_CLINIC_OR_DEPARTMENT_OTHER): Payer: Self-pay | Admitting: General Surgery

## 2011-06-26 DIAGNOSIS — S93439A Sprain of tibiofibular ligament of unspecified ankle, initial encounter: Secondary | ICD-10-CM | POA: Insufficient documentation

## 2011-06-26 DIAGNOSIS — M899 Disorder of bone, unspecified: Secondary | ICD-10-CM | POA: Insufficient documentation

## 2011-06-26 DIAGNOSIS — C61 Malignant neoplasm of prostate: Secondary | ICD-10-CM | POA: Insufficient documentation

## 2011-06-26 DIAGNOSIS — R52 Pain, unspecified: Secondary | ICD-10-CM

## 2011-06-26 DIAGNOSIS — M19079 Primary osteoarthritis, unspecified ankle and foot: Secondary | ICD-10-CM | POA: Insufficient documentation

## 2011-06-26 DIAGNOSIS — Z79899 Other long term (current) drug therapy: Secondary | ICD-10-CM | POA: Insufficient documentation

## 2011-06-26 DIAGNOSIS — M24676 Ankylosis, unspecified foot: Secondary | ICD-10-CM | POA: Insufficient documentation

## 2011-06-26 DIAGNOSIS — IMO0002 Reserved for concepts with insufficient information to code with codable children: Secondary | ICD-10-CM | POA: Insufficient documentation

## 2011-06-26 DIAGNOSIS — L97309 Non-pressure chronic ulcer of unspecified ankle with unspecified severity: Secondary | ICD-10-CM | POA: Insufficient documentation

## 2011-06-26 DIAGNOSIS — L97409 Non-pressure chronic ulcer of unspecified heel and midfoot with unspecified severity: Secondary | ICD-10-CM | POA: Insufficient documentation

## 2011-06-26 DIAGNOSIS — X58XXXA Exposure to other specified factors, initial encounter: Secondary | ICD-10-CM | POA: Insufficient documentation

## 2011-06-26 DIAGNOSIS — J449 Chronic obstructive pulmonary disease, unspecified: Secondary | ICD-10-CM | POA: Insufficient documentation

## 2011-06-26 DIAGNOSIS — J4489 Other specified chronic obstructive pulmonary disease: Secondary | ICD-10-CM | POA: Insufficient documentation

## 2011-06-26 DIAGNOSIS — M25579 Pain in unspecified ankle and joints of unspecified foot: Secondary | ICD-10-CM | POA: Insufficient documentation

## 2011-07-25 ENCOUNTER — Encounter: Payer: Self-pay | Admitting: Vascular Surgery

## 2011-07-31 ENCOUNTER — Encounter (HOSPITAL_BASED_OUTPATIENT_CLINIC_OR_DEPARTMENT_OTHER): Payer: Medicare Other | Attending: General Surgery

## 2011-07-31 ENCOUNTER — Encounter: Payer: Medicare Other | Admitting: Vascular Surgery

## 2011-07-31 DIAGNOSIS — J4489 Other specified chronic obstructive pulmonary disease: Secondary | ICD-10-CM | POA: Insufficient documentation

## 2011-07-31 DIAGNOSIS — C61 Malignant neoplasm of prostate: Secondary | ICD-10-CM | POA: Insufficient documentation

## 2011-07-31 DIAGNOSIS — J449 Chronic obstructive pulmonary disease, unspecified: Secondary | ICD-10-CM | POA: Insufficient documentation

## 2011-07-31 DIAGNOSIS — L97509 Non-pressure chronic ulcer of other part of unspecified foot with unspecified severity: Secondary | ICD-10-CM | POA: Insufficient documentation

## 2011-07-31 DIAGNOSIS — L97409 Non-pressure chronic ulcer of unspecified heel and midfoot with unspecified severity: Secondary | ICD-10-CM | POA: Insufficient documentation

## 2011-07-31 DIAGNOSIS — Z79899 Other long term (current) drug therapy: Secondary | ICD-10-CM | POA: Insufficient documentation

## 2011-08-07 ENCOUNTER — Encounter: Payer: Self-pay | Admitting: Vascular Surgery

## 2011-08-07 ENCOUNTER — Ambulatory Visit (INDEPENDENT_AMBULATORY_CARE_PROVIDER_SITE_OTHER): Payer: Medicare Other | Admitting: Vascular Surgery

## 2011-08-07 VITALS — BP 107/70 | HR 70 | Resp 16 | Ht 64.0 in | Wt 120.0 lb

## 2011-08-07 DIAGNOSIS — I7092 Chronic total occlusion of artery of the extremities: Secondary | ICD-10-CM

## 2011-08-07 NOTE — Progress Notes (Signed)
Subjective:     Patient ID: [name], male   DOB: 1926/03/18, 77 y.o.   MRN: [id number]  HPI Th he is a very pleasant 77 year old gentleman who is in a nursing facility. He is in today with his wife. e patient presents today for evaluation of bilateral lower extremity tissue loss. He also has significant pain associated with this. He reports this is quite severe and occurs at night. He has been seen at the wound Center and has had improvement of his heel ulceration. He is nonambulatory and is mostly left from bed to chair. He spends most of his time in the bed.  Review of Systems Vascular: Pain in his legs with lying flat. GI: Constipation. GU: Urinary frequency. He NT: Change in site. Psychiatric: Depression and anxiety alcohol withdrawal. All other review of systems negative    Past Medical History  Diagnosis Date  . Vitamin D deficiency   . Chronic lung disease   . Thrombocytopenia   . Anemia   . Dementia   . Glaucoma     chronic angle closure  . Cataract   . Osteoporosis   . Weight loss   . Ulcer     Pressure Ulcers on various body areas    History  Substance Use Topics  . Smoking status: Former Smoker    Quit date: 12/17/2008  . Smokeless tobacco: Not on file  . Alcohol Use: Yes     Lifelong alcoholic until 2008    Family History  Problem Relation Age of Onset  . Heart disease Sister     No Known Allergies  Current outpatient prescriptions:alendronate (FOSAMAX) 70 MG tablet, Take 70 mg by mouth every 7 (seven) days. Take with a full glass of water on an empty stomach. , Disp: , Rfl: ;  Ascorbic Acid (VITAMIN C PO), Take by mouth 2 (two) times daily.  , Disp: , Rfl: ;  calcium-vitamin D (OSCAL WITH D) 500-200 MG-UNIT per tablet, Take 1 tablet by mouth 2 (two) times daily.  , Disp: , Rfl:  Cholecalciferol (VITAMIN D PO), Take 400 Units by mouth daily. , Disp: , Rfl: ;  collagenase (SANTYL) ointment, Apply topically daily.  , Disp: , Rfl: ;  donepezil (ARICEPT) 10  MG tablet, Take 10 mg by mouth at bedtime.  , Disp: , Rfl: ;  Hydrocodone-Acetaminophen (VICODIN PO), Take by mouth as needed.  , Disp: , Rfl: ;  Iron-Vitamins (THEREMS H) TABS, Take by mouth.  , Disp: , Rfl:  memantine (NAMENDA) 10 MG tablet, Take 10 mg by mouth 2 (two) times daily.  , Disp: , Rfl: ;  pregabalin (LYRICA) 25 MG capsule, Take 25 mg by mouth 3 (three) times daily.  , Disp: , Rfl: ;  Protein (PROCEL) POWD, Take by mouth 2 (two) times daily.  , Disp: , Rfl: ;  senna (SENOKOT) 8.6 MG tablet, Take 1 tablet by mouth 2 (two) times daily.  , Disp: , Rfl:  travoprost, benzalkonium, (TRAVATAN) 0.004 % ophthalmic solution, 1 drop at bedtime.  , Disp: , Rfl: ;  vitamin B-12 (CYANOCOBALAMIN) 1000 MCG tablet, Take 1,000 mcg by mouth daily.  , Disp: , Rfl: ;  ZINC SULFATE PO, Take 220 mg by mouth. , Disp: , Rfl: ;  Ferrous Sulfate (IRON SUPPLEMENT PO), Take by mouth daily.  , Disp: , Rfl: ;  Multiple Vitamin (MULTIVITAMIN PO), Take by mouth daily.  , Disp: , Rfl:  PROTEIN PO, Take by mouth.  , Disp: , Rfl:  Filed Vitals:   08/07/11 1312  Height: 5\' 4"  (1.626 m)  Weight: 120 lb (54.432 kg)    Body mass index is 20.60 kg/(m^2).       Objective:   Physical Exam Alert black male in no acute distress. He is able to answer answers simple questions and discussed his disease. He has palpable radial pulses bilaterally he has a barely palpable femoral pulses bilaterally he has absent popliteal and distal pulses bilaterally. [place]arte without bruits HEENT is normal chest clear bilaterally. Heart regular rate and rhythm. Abdomen soft no masses noted. He has an ulceration over his left heel which does have a good granulating base and has a deeper ulceration over his right medial malleolus.  Lower extremity arterial studies: Noncompressible vessels    Assessment:     Severe bilateral lower trinity arterial insufficiency with rest pain and tissue loss. The patient is nonambulatory. I discussed options  with the family and the patient. I have recommended arteriography to determine if endovascular treatment as possible. He certainly is a marginal candidate for major arterial reconstructive surgery. He understands that his only option may be primary amputation.    Plan:     Outpatient arteriography for further evaluation of lower extremity tissue loss and ischemia and rest pain

## 2011-08-23 ENCOUNTER — Ambulatory Visit (HOSPITAL_COMMUNITY)
Admission: RE | Admit: 2011-08-23 | Discharge: 2011-08-23 | Disposition: A | Discharge status: Home / Self Care | Payer: Medicare Other | Source: Ambulatory Visit | Attending: Vascular Surgery | Admitting: Vascular Surgery

## 2011-08-23 DIAGNOSIS — I7092 Chronic total occlusion of artery of the extremities: Secondary | ICD-10-CM | POA: Insufficient documentation

## 2011-08-23 DIAGNOSIS — I739 Peripheral vascular disease, unspecified: Secondary | ICD-10-CM

## 2011-08-23 DIAGNOSIS — M2450 Contracture, unspecified joint: Secondary | ICD-10-CM | POA: Insufficient documentation

## 2011-08-23 DIAGNOSIS — L98499 Non-pressure chronic ulcer of skin of other sites with unspecified severity: Secondary | ICD-10-CM

## 2011-08-23 DIAGNOSIS — L97509 Non-pressure chronic ulcer of other part of unspecified foot with unspecified severity: Secondary | ICD-10-CM | POA: Insufficient documentation

## 2011-08-23 HISTORY — PX: PERIPHERAL VASCULAR CATHETERIZATION: SHX172C

## 2011-08-23 LAB — POCT I-STAT, CHEM 8
BUN: 30 mg/dL — ABNORMAL HIGH (ref 6–23)
Calcium, Ion: 1.25 mmol/L (ref 1.12–1.32)
Creatinine, Ser: 1.2 mg/dL (ref 0.50–1.35)
Glucose, Bld: 80 mg/dL (ref 70–99)
TCO2: 23 mmol/L (ref 0–100)

## 2011-08-27 NOTE — Op Note (Signed)
[name], [name] NO.:  [id number]  MEDICAL RECORD NO.:  [id number]  LOCATION:  SDSC                         FACILITY:  [facility]  PHYSICIAN:  Fransisco Hertz, MD       DATE OF BIRTH:  1926-07-19  DATE OF PROCEDURE:  08/23/2011 DATE OF DISCHARGE:  08/23/2011                              OPERATIVE REPORT   PROCEDURES: 1. Right common femoral artery cannulation under ultrasound guidance. 2. Second-order arterial selection. 3. Left leg runoff. 4. Right leg runoff.  PREOPERATIVE DIAGNOSIS:  Bilateral feet ulcers.  POSTOPERATIVE DIAGNOSIS:  Bilateral feet ulcers.  SURGEON:  Fransisco Hertz, MD  ANESTHESIA:  Conscious sedation.  ESTIMATED BLOOD LOSS:  Minimal.  CONTRAST:  100 mL.  SPECIMENS:  Were none.  FINDINGS: 1. Patent aorta. 2. Patent celiac and SMA arteries. 3. Right renal artery has a stenosis greater than 50%, but both renal     arteries are patent. 4. Patent bilateral common iliac, external iliac and internal iliac     artery.  Severely tortuous left iliac artery system, with 4 90 degree        turns from the aortic bifurcation to the left common femoral artery. 5. There is patent left common femoral artery and profunda artery and     superficial femoral artery. 6. There is a diseased left SFA muscle stenoses. 7. Diseased left popliteal artery. 8. Occluded left posterior tibial and anterior tibial. 9. There is a diseased peroneal artery which occludes distally with a     collateral flow which reconstitutes some what I think are just     collateral vessels onto the foot.  There is minimal filling     demonstrated on this injection. 10.There is patent right common femoral artery SFA and profunda     arteries. 11.There is diseased right SFA and popliteal artery. 12.A patent right peroneal artery and anterior tibial with the     anterior tibial being the dominant runoff. 13.There is a diseased right peroneal which fills collaterals near the  posterior tibial.  INDICATIONS:  This is an 77 year old gentleman quite debilitated with multiple comorbidities that presents with bilateral foot ulcers.  His exam and studies were consistent with bilateral lower extremity peripheral arterial disease.  It was felt he need an angiogram to determine  if he had any revascularization options.  The patient is aware of the risks of this procedure include bleeding, infection, renal failure, rupture, embolization, dissection and possible need for additional procedures.  He is aware of these risks and agreed to proceed forward.  DESCRIPTION OF OPERATION:  After full informed written consent was obtained from the patient, he was brought back to the operating room and placed supine upon the operating table.  Prior to proceeding, he was connected to monitoring equipment, and given conscious sedation the amounts which were documented in his chart.  I turned my attention to his right groin.  Under ultrasound guidance, I cannulated with an 18-gauge needle and passed the wire up into his aorta.  The needle was then exchanged for a 5-French sheath which was loaded into the common femoral artery. Dilator was removed and then Omniflush catheter was loaded over the wire  up to the level of L1.  This catheter was connected to the power injector circuit after performing declotting de-airing maneuver.  The power injector aortogram was as listed above.  This patient unfortunately has a contracture, so we could not do bilateral automated leg runoff.  I put the wire back into the catheter and selected out the left common iliac artery and unfortunately, I was not able to advance this wire down due to the tortuosity in his iliac system. The wire was exchanged for a Glidewire which I still had difficulty advancing any further then the proximal iliac system.  I then had to replace the exchange out the catheter for end-hole catheter which I did with great difficulty,  eventually I was able to place the catheter into the external  iliac artery.  I did a quick hand injection to verify my location in this  process and became evident there was four 90 degrees turns between the aortic bifurcation down to the level of the left common femoral artery  and that any endovascular procedure would be impossible to complete from a contralateral approach.   At this point, I connected the catheter to  a power injector circuit and sent him up for automated left leg runoff.   He unfortunately was not able to fully hold still and some of the images  were somewhat degraded, but with digital editing we were able to get  adequate images. As this gentleman has some degree of renal disease, I felt  that excessive dye load would not be acceptable in this case.  I then at this point based on the imaging, I did not think well that a endovascular solution was going to be adequate.  While I could potentially improve this state of the femoropopliteal segment, ultimately the peroneal artery remains diseased and distally occludes.  Only continuing outflow on the foot via collateral minuscule of collaterals.  Subsequently, I decided to abort any intervention and move on to diagnostic studies, pulled out the catheter and then connected the right femoral sheath to the power injector circuit and performed an automated right leg runoff the findings of which are listed above.  Then at this point disconnected the side port of the sheath from the power injector circuit and aspirated out.  No clot and instilled heparinized saline into it.  The patient tolerated the procedure fine.  The plan is to pull the sheath in the holding area.  COMPLICATIONS:  None.  CONDITION:  Stable.     Fransisco Hertz, MD     BLC/MEDQ  D:  08/23/2011  T:  08/23/2011  Job:  [id number]  Electronically Signed by Leonides Sake MD on 08/27/2011 10:57:59 AM

## 2011-08-28 ENCOUNTER — Encounter (HOSPITAL_BASED_OUTPATIENT_CLINIC_OR_DEPARTMENT_OTHER): Payer: Medicare Other | Attending: General Surgery

## 2011-08-28 DIAGNOSIS — J449 Chronic obstructive pulmonary disease, unspecified: Secondary | ICD-10-CM | POA: Insufficient documentation

## 2011-08-28 DIAGNOSIS — J4489 Other specified chronic obstructive pulmonary disease: Secondary | ICD-10-CM | POA: Insufficient documentation

## 2011-08-28 DIAGNOSIS — L97509 Non-pressure chronic ulcer of other part of unspecified foot with unspecified severity: Secondary | ICD-10-CM | POA: Insufficient documentation

## 2011-08-28 DIAGNOSIS — C61 Malignant neoplasm of prostate: Secondary | ICD-10-CM | POA: Insufficient documentation

## 2011-08-28 DIAGNOSIS — Z79899 Other long term (current) drug therapy: Secondary | ICD-10-CM | POA: Insufficient documentation

## 2011-08-28 DIAGNOSIS — L97409 Non-pressure chronic ulcer of unspecified heel and midfoot with unspecified severity: Secondary | ICD-10-CM | POA: Insufficient documentation

## 2011-09-17 ENCOUNTER — Encounter: Payer: Self-pay | Admitting: Vascular Surgery

## 2011-09-18 ENCOUNTER — Ambulatory Visit: Payer: Medicare Other | Admitting: Vascular Surgery

## 2011-10-08 ENCOUNTER — Encounter: Payer: Self-pay | Admitting: Vascular Surgery

## 2011-10-09 ENCOUNTER — Ambulatory Visit (INDEPENDENT_AMBULATORY_CARE_PROVIDER_SITE_OTHER): Payer: Medicare Other | Admitting: Vascular Surgery

## 2011-10-09 ENCOUNTER — Encounter: Payer: Self-pay | Admitting: Vascular Surgery

## 2011-10-09 VITALS — BP 124/68 | HR 39 | Resp 16

## 2011-10-09 DIAGNOSIS — L98499 Non-pressure chronic ulcer of skin of other sites with unspecified severity: Secondary | ICD-10-CM

## 2011-10-09 NOTE — Progress Notes (Signed)
The patient presents today for followup of bilateral lower extremity tissue loss. He underwent outpatient arteriogram on 08/23/2011. This revealed tibial occlusive disease unreconstructable. Should he has continued to have good healing. He does have palpable femoral pulses and absent popliteal and distal pulses. He has healed his medial malleolus and left heel ulcer completely. I discussed this with his family present. They understand the need for continued good foot care and diligence. They understand the need for Vilda Zollner a local wound care should he develop an ulceration. He does have pressure noncontact bruits and I have suggested that he continue where these while he is in bed but not does not need to wear them while he's in a wheelchair. He will see [place] again on an as-needed basis.

## 2011-10-09 NOTE — H&P (Signed)
  NAMELEARY, MCNULTY NO.:  [id number]  MEDICAL RECORD NO.:  [id number]  LOCATION:  FOOT                         FACILITY:  [facility]  PHYSICIAN:  Joanne Gavel, M.D.        DATE OF BIRTH:  Apr 28, 1926  DATE OF ADMISSION:  06/05/2011 DATE OF DISCHARGE:                             HISTORY & PHYSICAL   CHIEF COMPLAINT:  Wound on right ankle and left heel.  HISTORY OF PRESENT ILLNESS:  This is an 77 year old male living at [facility].  He is quite demented and cannot give essentially no history.  He presents with these 2 wounds.  PAST MEDICAL HISTORY:  Significant for: 1. Prostate cancer chronic. 2. Obstructive pulmonary disease. 3. He also has cataracts, glaucoma, a history of thrombocytopenia and     vitamin D deficiency.  SOCIAL HISTORY:  He is not smoking at present or drinking alcohol.  ALLERGIES:  None.  MEDICATIONS:  Travatan, benazepril, alendronate, Namenda, multiple vitamins, and zinc.  PAST SURGICAL HISTORY:  Not particularly obtainable.  PHYSICAL EXAMINATION:  VITAL SIGNS:  Temperature 97, pulse 70 and irregular, respirations 16, blood pressure 108/66. GENERAL APPEARANCE:  Very thin, demented individual, in no distress. EYES, EARS, NOSE, THROAT:  Normal.  Bilateral symmetrical strength. CHEST:  Clear. HEART:  Irregular in rhythm. ABDOMEN:  Not examined. EXTREMITIES:  Hairless feet with no loss of sensation or movement.  I cannot palpate peripheral pulses, although ABI on the right is 1.2, unobtainable on the left.  On the heel, there is a 2.8 x 3 full- thickness ulceration with a large black eschar.  On the right, there is a 0.4 x 0.7 more superficial ulceration.  IMPRESSION:  Probable decubitus wounds with ulcerations bilaterally.  PLAN:  Sharp excision of the full-thickness eschar is accomplished.  We will treat wounds with Santyl and Medihoney, and we will see him in 7 days.  In addition, complete avoidance of pressure with floating of  the heel and special boots is recommended.     Joanne Gavel, M.D.     RA/MEDQ  D:  06/05/2011  T:  06/06/2011  Job:  [id number]  Electronically Signed by Joanne Gavel M.D. on 10/09/2011 09:07:26 AM

## 2011-10-25 ENCOUNTER — Encounter: Payer: Self-pay | Admitting: Vascular Surgery

## 2011-10-25 DIAGNOSIS — L98499 Non-pressure chronic ulcer of skin of other sites with unspecified severity: Secondary | ICD-10-CM

## 2011-10-25 DIAGNOSIS — I739 Peripheral vascular disease, unspecified: Secondary | ICD-10-CM | POA: Insufficient documentation

## 2011-10-25 DIAGNOSIS — I7025 Atherosclerosis of native arteries of other extremities with ulceration: Secondary | ICD-10-CM | POA: Insufficient documentation

## 2013-03-20 DIAGNOSIS — N183 Chronic kidney disease, stage 3 (moderate): Secondary | ICD-10-CM

## 2013-03-20 DIAGNOSIS — R630 Anorexia: Secondary | ICD-10-CM

## 2013-03-20 DIAGNOSIS — F4321 Adjustment disorder with depressed mood: Secondary | ICD-10-CM

## 2013-03-20 DIAGNOSIS — D509 Iron deficiency anemia, unspecified: Secondary | ICD-10-CM

## 2013-03-20 DIAGNOSIS — I739 Peripheral vascular disease, unspecified: Secondary | ICD-10-CM

## 2013-03-23 ENCOUNTER — Other Ambulatory Visit: Payer: Self-pay | Admitting: *Deleted

## 2013-03-23 MED ORDER — HYDROCODONE-ACETAMINOPHEN 5-325 MG PO TABS: ORAL_TABLET | ORAL | Status: DC

## 2013-04-01 ENCOUNTER — Other Ambulatory Visit: Payer: Self-pay | Admitting: Geriatric Medicine

## 2013-04-01 MED ORDER — PREGABALIN 25 MG PO CAPS: 25.0000 mg | ORAL_CAPSULE | Freq: Three times a day (TID) | ORAL | Status: DC

## 2013-04-17 ENCOUNTER — Other Ambulatory Visit: Payer: Self-pay | Admitting: *Deleted

## 2013-04-17 DIAGNOSIS — D6949 Other primary thrombocytopenia: Secondary | ICD-10-CM

## 2013-04-17 DIAGNOSIS — F329 Major depressive disorder, single episode, unspecified: Secondary | ICD-10-CM

## 2013-04-17 DIAGNOSIS — E46 Unspecified protein-calorie malnutrition: Secondary | ICD-10-CM

## 2013-04-17 DIAGNOSIS — F0390 Unspecified dementia without behavioral disturbance: Secondary | ICD-10-CM

## 2013-04-17 DIAGNOSIS — IMO0002 Reserved for concepts with insufficient information to code with codable children: Secondary | ICD-10-CM

## 2013-04-17 MED ORDER — PREGABALIN 50 MG PO CAPS: ORAL_CAPSULE | ORAL | Status: DC

## 2013-05-21 DIAGNOSIS — F0390 Unspecified dementia without behavioral disturbance: Secondary | ICD-10-CM

## 2013-05-21 DIAGNOSIS — E46 Unspecified protein-calorie malnutrition: Secondary | ICD-10-CM

## 2013-05-21 DIAGNOSIS — G909 Disorder of the autonomic nervous system, unspecified: Secondary | ICD-10-CM

## 2013-06-24 ENCOUNTER — Encounter: Payer: Self-pay | Admitting: *Deleted

## 2013-07-10 ENCOUNTER — Non-Acute Institutional Stay (SKILLED_NURSING_FACILITY): Payer: PRIVATE HEALTH INSURANCE | Admitting: Internal Medicine

## 2013-07-10 DIAGNOSIS — K5900 Constipation, unspecified: Secondary | ICD-10-CM

## 2013-07-10 DIAGNOSIS — C61 Malignant neoplasm of prostate: Secondary | ICD-10-CM | POA: Insufficient documentation

## 2013-07-10 DIAGNOSIS — F329 Major depressive disorder, single episode, unspecified: Secondary | ICD-10-CM

## 2013-07-10 DIAGNOSIS — N189 Chronic kidney disease, unspecified: Secondary | ICD-10-CM | POA: Insufficient documentation

## 2013-07-10 DIAGNOSIS — H269 Unspecified cataract: Secondary | ICD-10-CM | POA: Insufficient documentation

## 2013-07-10 DIAGNOSIS — M810 Age-related osteoporosis without current pathological fracture: Secondary | ICD-10-CM

## 2013-07-10 DIAGNOSIS — F1021 Alcohol dependence, in remission: Secondary | ICD-10-CM | POA: Insufficient documentation

## 2013-07-10 DIAGNOSIS — G609 Hereditary and idiopathic neuropathy, unspecified: Secondary | ICD-10-CM

## 2013-07-10 DIAGNOSIS — G5793 Unspecified mononeuropathy of bilateral lower limbs: Secondary | ICD-10-CM

## 2013-07-10 DIAGNOSIS — F0280 Dementia in other diseases classified elsewhere without behavioral disturbance: Secondary | ICD-10-CM | POA: Insufficient documentation

## 2013-07-10 DIAGNOSIS — E539 Vitamin B deficiency, unspecified: Secondary | ICD-10-CM | POA: Insufficient documentation

## 2013-07-10 DIAGNOSIS — F3289 Other specified depressive episodes: Secondary | ICD-10-CM

## 2013-07-10 DIAGNOSIS — D649 Anemia, unspecified: Secondary | ICD-10-CM

## 2013-07-10 NOTE — Progress Notes (Signed)
Patient ID: MAGNUM LUNDE, male   DOB: [birth year], 79 y.o.   MRN: [id number]  Code Status: full code  No Known Allergies  Chief Complaint  Patient presents with  . Medical Managment of Chronic Issues    HPI:  79 y/o male patient is here for long term care. He is seen today for routine visit. He has been eating well and getting OOB. Weight remains stable. No falls, sores or behavior changes reported. Patient has dementia. Denies any complaints. No new concerns from staff  Review of Systems  Constitutional: Negative for fever and chills.  Respiratory: Negative for cough and shortness of breath.   Cardiovascular: Negative for chest pain and palpitations.  Gastrointestinal: Negative for heartburn, nausea, vomiting and abdominal pain.  Genitourinary: Negative for dysuria.       Urinary incontinence  Skin: Negative for rash.  Neurological: Negative for dizziness and headaches.  Psychiatric/Behavioral: Positive for memory loss.     Past Medical History  Diagnosis Date  . Vitamin D deficiency   . Chronic lung disease   . Thrombocytopenia   . Anemia   . Dementia   . Glaucoma     chronic angle closure  . Cataract   . Osteoporosis   . Weight loss   . Ulcer     Pressure Ulcers on various body areas  . Alzheimer's disease   . Unspecified constipation   . Incomplete bladder emptying   . Injury to tibial vessel(s), unspecified   . Unspecified vitamin B deficiency   . Pressure ulcer, unspecified site(707.00)   . Depressive disorder, not elsewhere classified   . Unspecified cataract   . Malignant neoplasm of prostate   . Impotence of organic origin   . Alzheimer's disease   . Chronic kidney disease, unspecified    Past Surgical History  Procedure Laterality Date  . Eye surgery  cataract surgery bilateral   2012  . Aortogram  08-23-2011   Social History:   reports that he quit smoking about 4 years ago. He does not have any smokeless tobacco history on file. He reports  that  drinks alcohol. He reports that he does not use illicit drugs.  Family History  Problem Relation Age of Onset  . Heart disease Sister     Medications: Patient's Medications  New Prescriptions   No medications on file  Previous Medications   CALCIUM-VITAMIN D (OSCAL WITH D) 500-200 MG-UNIT PER TABLET    Take 1 tablet by mouth 2 (two) times daily.     CHOLECALCIFEROL (VITAMIN D PO)    Take 2,000 Units by mouth daily.    DONEPEZIL (ARICEPT) 10 MG TABLET    Take 10 mg by mouth at bedtime.     FERROUS SULFATE (IRON SUPPLEMENT PO)    Take 1 capsule by mouth daily.    HYDROCODONE-ACETAMINOPHEN (NORCO/VICODIN) 5-325 MG PER TABLET    Take 1 tablet twice a day for pain   LACTULOSE (CHRONULAC) 10 GM/15ML SOLUTION    Take 20 g by mouth daily.   MEMANTINE HCL ER (NAMENDA XR) 28 MG CP24    Take 1 capsule by mouth daily.   MIRTAZAPINE (REMERON) 15 MG TABLET    Take 15 mg by mouth at bedtime.     PREGABALIN (LYRICA) 50 MG CAPSULE    Take one tablet twice a day for neuropathy   SENNA (SENOKOT) 8.6 MG TABLET    Take 1 tablet by mouth 2 (two) times daily.     TRAVOPROST, BENZALKONIUM, (TRAVATAN)  0.004 % OPHTHALMIC SOLUTION    1 drop at bedtime.     VITAMIN B-12 (CYANOCOBALAMIN) 1000 MCG TABLET    Take 1,000 mcg by mouth daily.    Modified Medications   No medications on file  Discontinued Medications   ALENDRONATE (FOSAMAX) 70 MG TABLET    Take 70 mg by mouth every 7 (seven) days. Take with a full glass of water on an empty stomach.    ASCORBIC ACID (VITAMIN C PO)    Take by mouth 2 (two) times daily.     COLLAGENASE (SANTYL) OINTMENT    Apply topically daily.     HYDROCODONE-ACETAMINOPHEN (VICODIN PO)    Take by mouth as needed.     IRON-VITAMINS (THEREMS H) TABS    Take by mouth.     MEMANTINE (NAMENDA) 10 MG TABLET    Take 10 mg by mouth 2 (two) times daily.     MULTIPLE VITAMIN (MULTIVITAMIN PO)    Take by mouth daily.     PROTEIN (PROCEL) POWD    Take by mouth 2 (two) times daily.      PROTEIN PO    Take by mouth.     ZINC SULFATE PO    Take 220 mg by mouth.      Physical Exam:  Filed Vitals:   07/10/13 1220  BP: 110/60  Pulse: 68  Temp: 98.3 F (36.8 C)  Resp: 18  Weight: 148 lb 9.6 oz (67.405 kg)  SpO2: 96%   Physical Exam  Constitutional:  Elderly , thin, in NAD  HENT:  Head: Normocephalic and atraumatic.  Mouth/Throat: Oropharynx is clear and moist.  Poor upper dentures, missing lower ones  Eyes: Conjunctivae are normal. Pupils are equal, round, and reactive to light.  Neck: Normal range of motion. Neck supple.  Musculoskeletal: Normal range of motion. He exhibits no edema.  Amputated 5th right finger at PIP joint, bunions b/l  Neurological: He is alert.  Skin: Skin is warm and dry.  Psychiatric: He has a normal mood and affect. His behavior is normal.     Labs reviewed:  See patient's chart- reviewed  Assessment/Plan  Osteoporosis- on vit d and oscal, off fosamax. No falls reported  Dementia- continue donepezil and namenda xr, monitor behavior, po intake, weight and provide skin care  Iron def anemia- continue iron supplement. Monitor cbc  Constipation- continue enulose and senna s, monitor  Depression- continue remeron which has helped with his mood, made him more interactive and also improved his oral intake  Neuropathic leg pain- continue lyrica,tolerating increased dose well. Also continue norco  Labs/tests ordered- routine cbc, cmp

## 2013-07-17 ENCOUNTER — Other Ambulatory Visit: Payer: Self-pay

## 2013-07-17 LAB — BASIC METABOLIC PANEL
Anion Gap: 10 (ref 7–16)
BUN: 28 mg/dL — ABNORMAL HIGH (ref 7–18)
Chloride: 112 mmol/L — ABNORMAL HIGH (ref 98–107)
Co2: 21 mmol/L (ref 21–32)
Co2: 27 mmol/L (ref 21–32)
Creatinine: 1.32 mg/dL — ABNORMAL HIGH (ref 0.60–1.30)
EGFR (African American): 57 — ABNORMAL LOW
Glucose: 107 mg/dL — ABNORMAL HIGH (ref 65–99)
Osmolality: 294 (ref 275–301)
Potassium: 5.7 mmol/L — ABNORMAL HIGH (ref 3.5–5.1)
Potassium: 4.2 mmol/L (ref 3.5–5.1)

## 2013-07-17 LAB — CBC WITH DIFFERENTIAL/PLATELET
Basophil #: 0 10*3/uL (ref 0.0–0.1)
Basophil #: 0 10*3/uL (ref 0.0–0.1)
Basophil %: 0.1 %
Eosinophil #: 0 10*3/uL (ref 0.0–0.7)
Eosinophil %: 0.5 %
Eosinophil %: 1.2 %
HCT: 31.4 % — ABNORMAL LOW (ref 40.0–52.0)
HGB: 10.4 g/dL — ABNORMAL LOW (ref 13.0–18.0)
Lymphocyte #: 0.5 10*3/uL — ABNORMAL LOW (ref 1.0–3.6)
Lymphocyte #: 2.5 10*3/uL (ref 1.0–3.6)
Lymphocyte %: 13.8 %
Lymphocyte %: 14.1 %
MCH: 31.8 pg (ref 26.0–34.0)
MCHC: 33.3 g/dL (ref 32.0–36.0)
MCV: 96 fL (ref 80–100)
MCV: 95 fL (ref 80–100)
Monocyte %: 6 %
Neutrophil #: 13.8 10*3/uL — ABNORMAL HIGH (ref 1.4–6.5)
Neutrophil %: 78.5 %
Platelet: 15 10*3/uL — CL (ref 150–440)
Platelet: 116 10*3/uL — ABNORMAL LOW (ref 150–440)
RBC: 3.28 10*6/uL — ABNORMAL LOW (ref 4.40–5.90)
RDW: 15 % — ABNORMAL HIGH (ref 11.5–14.5)
WBC: 3.8 10*3/uL (ref 3.8–10.6)
WBC: 17.6 10*3/uL — ABNORMAL HIGH (ref 3.8–10.6)

## 2013-07-18 ENCOUNTER — Other Ambulatory Visit: Payer: Self-pay | Admitting: Internal Medicine

## 2013-07-18 LAB — URINALYSIS, COMPLETE
Bilirubin,UR: NEGATIVE
Hyaline Cast: 2
Ketone: NEGATIVE
Nitrite: POSITIVE
Ph: 6 (ref 4.5–8.0)
Protein: 30
RBC,UR: 30 /HPF (ref 0–5)
Specific Gravity: 1.024 (ref 1.003–1.030)

## 2013-07-19 LAB — URINE CULTURE

## 2013-08-27 ENCOUNTER — Non-Acute Institutional Stay (SKILLED_NURSING_FACILITY): Payer: PRIVATE HEALTH INSURANCE | Admitting: Internal Medicine

## 2013-08-27 ENCOUNTER — Encounter: Payer: Self-pay | Admitting: Internal Medicine

## 2013-08-27 DIAGNOSIS — E559 Vitamin D deficiency, unspecified: Secondary | ICD-10-CM

## 2013-08-27 DIAGNOSIS — K5900 Constipation, unspecified: Secondary | ICD-10-CM

## 2013-08-27 DIAGNOSIS — F329 Major depressive disorder, single episode, unspecified: Secondary | ICD-10-CM

## 2013-08-27 DIAGNOSIS — H409 Unspecified glaucoma: Secondary | ICD-10-CM

## 2013-08-27 DIAGNOSIS — D649 Anemia, unspecified: Secondary | ICD-10-CM

## 2013-08-27 DIAGNOSIS — E539 Vitamin B deficiency, unspecified: Secondary | ICD-10-CM

## 2013-08-27 DIAGNOSIS — F0280 Dementia in other diseases classified elsewhere without behavioral disturbance: Secondary | ICD-10-CM

## 2013-08-27 NOTE — Progress Notes (Signed)
Patient ID: [name], male   DOB: 26-Jun-1926, 79 y.o.   MRN: [id number]  Chief Complaint  Patient presents with  . Medical Managment of Chronic Issues    No Known Allergies  [name]  Code status: full code  HPI:   79 y/o male patient is here for long term Perry. He is seen today for routine visit. He has been eating well and getting OOB. He has dementia and has been at his baseline. Weight remains stable. No falls, sores or behavior changes reported. no new concerns from staff. He is tired after his lunch and want to lie down  Review of Systems  Constitutional: Negative for fever and chills.  Respiratory: Negative for cough and shortness of breath.   Cardiovascular: Negative for chest pain and palpitations.  Gastrointestinal: Negative for heartburn, nausea, vomiting and abdominal pain.  Genitourinary: Negative for dysuria.        Urinary incontinence  Skin: Negative for rash.  Neurological: Negative for dizziness and headaches.  Psychiatric/Behavioral: Positive for memory loss.   Past Medical History  Diagnosis Date  . Vitamin D deficiency   . Chronic lung disease   . Thrombocytopenia   . Anemia   . Dementia   . Glaucoma     chronic angle closure  . Cataract   . Osteoporosis   . Weight loss   . Ulcer     Pressure Ulcers on various body areas  . Alzheimer's disease   . Unspecified constipation   . Incomplete bladder emptying   . Injury to tibial vessel(s), unspecified   . Unspecified vitamin B deficiency   . Pressure ulcer, unspecified site(707.00)   . Depressive disorder, not elsewhere classified   . Unspecified cataract   . Malignant neoplasm of prostate   . Impotence of organic origin   . Alzheimer's disease   . Chronic kidney disease, unspecified    Current Outpatient Prescriptions on File Prior to Visit  Medication Sig Dispense Refill  . calcium-vitamin D (OSCAL WITH D) 500-200 MG-UNIT per tablet Take 1 tablet by mouth 2 (two) times  daily.        . Cholecalciferol (VITAMIN D PO) Take 2,000 Units by mouth daily.       Marland Kitchen donepezil (ARICEPT) 10 MG tablet Take 10 mg by mouth at bedtime.        . Ferrous Sulfate (IRON SUPPLEMENT PO) Take 1 capsule by mouth daily.       Marland Kitchen HYDROcodone-acetaminophen (NORCO/VICODIN) 5-325 MG per tablet Take 1 tablet twice a day for pain  60 tablet  5  . lactulose (CHRONULAC) 10 GM/15ML solution Take 20 g by mouth daily.      . Memantine HCl ER (NAMENDA XR) 28 MG CP24 Take 1 capsule by mouth daily.      . mirtazapine (REMERON) 15 MG tablet Take 15 mg by mouth at bedtime.        . pregabalin (LYRICA) 50 MG capsule Take one tablet twice a day for neuropathy  60 capsule  5  . senna (SENOKOT) 8.6 MG tablet Take 1 tablet by mouth 2 (two) times daily.        . travoprost, benzalkonium, (TRAVATAN) 0.004 % ophthalmic solution 1 drop at bedtime.        . vitamin B-12 (CYANOCOBALAMIN) 1000 MCG tablet Take 1,000 mcg by mouth daily.         No current facility-administered medications on file prior to visit.   Past Surgical History  Procedure  Laterality Date  . Eye surgery  cataract surgery bilateral   2012  . Aortogram  08-23-2011     Physical Exam   BP 140/75  Pulse 60  Temp(Src) 97 F (36.1 C)  Resp 18  SpO2 96%  Constitutional:  Elderly , thin, in NAD  HENT:   Head: Normocephalic and atraumatic.   Mouth/Throat: Oropharynx is clear and moist.  Poor upper dentures, missing lower ones  Eyes: Conjunctivae are normal. Pupils are equal, round, and reactive to light.  Neck: Normal range of motion. Neck supple.  Musculoskeletal: Normal range of motion. He exhibits no edema.  Amputated 5th right finger at PIP joint, bunions b/l  Neurological: He is alert.  Skin: Skin is warm and dry.  Psychiatric: He has a normal mood and affect. His behavior is normal.    Labs reviewed:  See patient's chart- reviewed   Assessment/Plan  Dementia- continue donepezil and namenda. Frequent cueing and  assistance required  Iron def anemia- continue iron supplement. Monitor cbc  Neuropathic leg pain- continue lyrica and prn norco  Osteoporosis- on vit d and oscal, off fosamax. No falls reported  Constipation- continue enulose and senna s, monitor  Depression- continue remeron for now, tolerating well  Glaucoma- continue his eye drops  Vit b12 def- continue b12 supplement

## 2013-09-21 ENCOUNTER — Other Ambulatory Visit: Payer: Self-pay | Admitting: *Deleted

## 2013-09-21 MED ORDER — HYDROCODONE-ACETAMINOPHEN 5-325 MG PO TABS: ORAL_TABLET | ORAL | Status: DC

## 2013-10-15 ENCOUNTER — Non-Acute Institutional Stay (SKILLED_NURSING_FACILITY): Payer: PRIVATE HEALTH INSURANCE | Admitting: Internal Medicine

## 2013-10-15 DIAGNOSIS — K5900 Constipation, unspecified: Secondary | ICD-10-CM

## 2013-10-15 DIAGNOSIS — F0280 Dementia in other diseases classified elsewhere without behavioral disturbance: Secondary | ICD-10-CM

## 2013-10-15 DIAGNOSIS — E46 Unspecified protein-calorie malnutrition: Secondary | ICD-10-CM

## 2013-10-15 DIAGNOSIS — D509 Iron deficiency anemia, unspecified: Secondary | ICD-10-CM

## 2013-10-15 NOTE — Progress Notes (Signed)
Patient ID: SCOT SHIRAISHI, male   DOB: 1926/11/04, 79 y.o.   MRN: [id number]  No Known Allergies  [facility]  Code status: full code  Chief complaint- medical management of chronic illness  HPI:   79 y/o male patient is here for long term care. He is seen today for routine visit. He has dementia and is at his baseline.He has been eating well and getting OOB. Weight remains stable. No falls, sores or behavior changes reported. no new concerns from staff.  Review of Systems   Constitutional: Negative for fever and chills.   Respiratory: Negative for cough and shortness of breath.    Cardiovascular: Negative for chest pain and palpitations.   Gastrointestinal: Negative for heartburn, nausea, vomiting and abdominal pain.   Genitourinary: Negative for dysuria.        Urinary incontinence   Skin: Negative for rash.   Neurological: Negative for dizziness and headaches.   Psychiatric/Behavioral: Positive for memory loss.   Past Medical History  Diagnosis Date  . Vitamin D deficiency   . Chronic lung disease   . Thrombocytopenia   . Anemia   . Dementia   . Glaucoma     chronic angle closure  . Cataract   . Osteoporosis   . Weight loss   . Ulcer     Pressure Ulcers on various body areas  . Alzheimer's disease   . Unspecified constipation   . Incomplete bladder emptying   . Injury to tibial vessel(s), unspecified   . Unspecified vitamin B deficiency   . Pressure ulcer, unspecified site(707.00)   . Depressive disorder, not elsewhere classified   . Unspecified cataract   . Malignant neoplasm of prostate   . Impotence of organic origin   . Alzheimer's disease   . Chronic kidney disease, unspecified    Past Surgical History  Procedure Laterality Date  . Eye surgery  cataract surgery bilateral   2012  . Aortogram  08-23-2011   Medication reviewed. See [facility]  Physical exam  Nursing note and vital signs reviewed   Constitutional:  Elderly , thin, in NAD    HENT:   Head: Normocephalic and atraumatic.   Mouth/Throat: Oropharynx is clear and moist.  Poor upper dentures, missing lower ones   Eyes: Conjunctivae are normal. Pupils are equal, round, and reactive to light.   Neck: Normal range of motion. Neck supple.  Musculoskeletal: Normal range of motion. He exhibits no edema.  Amputated 5th right finger at PIP joint, bunions b/l  Neurological: He is alert.   Skin: Skin is warm and dry.  Psychiatric: He has a normal mood and affect. His behavior is normal.    Labs reviewed:  08/11/13 wbc 3.9, hb 12.2, hct 39.4, plt 125, na 143, k 3.9, bun 25, cr 1.4, glu 105, ca 9.5, tsh 1.267  Assessment/Plan  Protein calorie malnutrition- continue remeron for appetite stimulation nad his cetravite, encoruage po intake. Weight monitor. Continue mechanical soft diet  Dementia- continue donepezil and namenda. Frequent cueing and assistance required  Osteoporosis- on vit d and oscal, off fosamax. No falls reported  Constipation- continue enulose and senna s, monitor  Iron def anemia- continue iron supplement. Monitor cbc  Neuropathic leg pain- continue lyrica and prn norco  Glaucoma- continue his eye drops  Vit b12 def- normal b12 level. Will stop b12 supplement

## 2013-10-19 ENCOUNTER — Other Ambulatory Visit: Payer: Self-pay | Admitting: *Deleted

## 2013-10-19 MED ORDER — HYDROCODONE-ACETAMINOPHEN 5-325 MG PO TABS: ORAL_TABLET | ORAL | Status: DC

## 2013-10-19 MED ORDER — PREGABALIN 50 MG PO CAPS: ORAL_CAPSULE | ORAL | Status: DC

## 2013-10-20 ENCOUNTER — Other Ambulatory Visit: Payer: Self-pay | Admitting: *Deleted

## 2013-11-10 ENCOUNTER — Other Ambulatory Visit: Payer: Self-pay

## 2013-11-10 MED ORDER — HYDROCODONE-ACETAMINOPHEN 5-325 MG PO TABS: ORAL_TABLET | ORAL | Status: DC

## 2013-11-19 ENCOUNTER — Other Ambulatory Visit: Payer: Self-pay | Admitting: *Deleted

## 2013-11-19 MED ORDER — HYDROCODONE-ACETAMINOPHEN 5-325 MG PO TABS: ORAL_TABLET | ORAL | Status: DC

## 2013-11-26 ENCOUNTER — Encounter: Payer: Self-pay | Admitting: Internal Medicine

## 2013-11-26 ENCOUNTER — Non-Acute Institutional Stay (SKILLED_NURSING_FACILITY): Payer: PRIVATE HEALTH INSURANCE | Admitting: Internal Medicine

## 2013-11-26 DIAGNOSIS — F0280 Dementia in other diseases classified elsewhere without behavioral disturbance: Secondary | ICD-10-CM | POA: Insufficient documentation

## 2013-11-26 DIAGNOSIS — J309 Allergic rhinitis, unspecified: Secondary | ICD-10-CM | POA: Insufficient documentation

## 2013-11-26 DIAGNOSIS — D509 Iron deficiency anemia, unspecified: Secondary | ICD-10-CM

## 2013-11-26 DIAGNOSIS — E46 Unspecified protein-calorie malnutrition: Secondary | ICD-10-CM

## 2013-11-26 NOTE — Progress Notes (Signed)
Patient ID: [name], male   DOB: [birth year], 79 y.o.   MRN: [id number]  No Known Allergies  [facility]  Code status: full code  Chief complaint- medical management of chronic illness  HPI:   79 y/o male patient is here for long term care. He is seen today for routine visit. He has dementia and is at his baseline.He has been eating well and getting OOB. He has been having runny nose with clear watery discharge for past few days.Weight remains stable. No falls, sores or behavior changes reported. no new concerns from staff.  Review of Systems   Constitutional: Negative for fever and chills.   Respiratory: Negative for cough and shortness of breath.    Cardiovascular: Negative for chest pain and palpitations.   Gastrointestinal: Negative for heartburn, nausea, vomiting and abdominal pain.   Genitourinary: Negative for dysuria.        Urinary incontinence   Skin: Negative for rash.   Neurological: Negative for dizziness and headaches.   Psychiatric/Behavioral: Positive for memory loss.   Past Medical History  Diagnosis Date  . Vitamin D deficiency   . Chronic lung disease   . Thrombocytopenia   . Anemia   . Dementia   . Glaucoma     chronic angle closure  . Cataract   . Osteoporosis   . Weight loss   . Ulcer     Pressure Ulcers on various body areas  . Alzheimer's disease   . Unspecified constipation   . Incomplete bladder emptying   . Injury to tibial vessel(s), unspecified   . Unspecified vitamin B deficiency   . Pressure ulcer, unspecified site(707.00)   . Depressive disorder, not elsewhere classified   . Unspecified cataract   . Malignant neoplasm of prostate   . Impotence of organic origin   . Alzheimer's disease   . Chronic kidney disease, unspecified    Past Surgical History  Procedure Laterality Date  . Eye surgery  cataract surgery bilateral   2012  . Aortogram  08-23-2011    Medication reviewed. See [facility]  Physical exam  BP 127/81   Pulse 81  Temp(Src) 98.6 F (37 C)  Resp 18  SpO2 96%  Nursing note and vital signs reviewed   Constitutional:  Elderly , thin, in NAD   HENT:   Head: Normocephalic and atraumatic.   Mouth/Throat: Oropharynx is clear and moist.  Poor upper dentures, missing lower ones. Runny nose with clear watery discharge Eyes: Conjunctivae are normal. Pupils are equal, round, and reactive to light.   Neck: Normal range of motion. Neck supple.  Musculoskeletal: Normal range of motion. He exhibits no edema.  Amputated 5th right finger at PIP joint, bunions b/l  Neurological: He is alert.   Skin: Skin is warm and dry.  Psychiatric: He has a normal mood and affect. His behavior is normal.   Labs reviewed:  08/11/13 wbc 3.9, hb 12.2, hct 39.4, plt 125, na 143, k 3.9, bun 25, cr 1.4, glu 105, ca 9.5, tsh 1.267  Assessment/Plan  Allergic rhinitis- will have him on flonase daily and reassess  Protein calorie malnutrition- stable. Weight also has been stable and pt is more OOB and about. Will decrease his remeron to 7.5 mg daily and taper him off it next visit. encoruage po intake. Weight monitor. Continue mechanical soft diet  Dementia- continue donepezil and namenda. Frequent cueing and assistance required. Fall precautions and skin care  Neuropathic leg pain- continue lyrica and prn norco

## 2013-12-22 ENCOUNTER — Other Ambulatory Visit: Payer: Self-pay | Admitting: *Deleted

## 2013-12-22 MED ORDER — HYDROCODONE-ACETAMINOPHEN 5-325 MG PO TABS: ORAL_TABLET | ORAL | Status: DC

## 2014-01-15 ENCOUNTER — Encounter: Payer: Self-pay | Admitting: Internal Medicine

## 2014-01-15 ENCOUNTER — Non-Acute Institutional Stay (SKILLED_NURSING_FACILITY): Payer: PRIVATE HEALTH INSURANCE | Admitting: Internal Medicine

## 2014-01-15 DIAGNOSIS — F0280 Dementia in other diseases classified elsewhere without behavioral disturbance: Secondary | ICD-10-CM

## 2014-01-15 DIAGNOSIS — D649 Anemia, unspecified: Secondary | ICD-10-CM

## 2014-01-15 DIAGNOSIS — G309 Alzheimer's disease, unspecified: Principal | ICD-10-CM

## 2014-01-15 DIAGNOSIS — E46 Unspecified protein-calorie malnutrition: Secondary | ICD-10-CM

## 2014-01-15 NOTE — Progress Notes (Signed)
Patient ID: [name], male   DOB: 1926-05-19, 80 y.o.   MRN: [id number]     No Known Allergies  [facility]  Code status: full code  Chief complaint- medical management of chronic illness  HPI:   80 y/o male patient is here for long term care. He is seen today for routine visit. He has dementia, eating well and getting OOB. no new concerns from staff.  Review of Systems   Constitutional: Negative for fever and chills.   Respiratory: Negative for cough and shortness of breath.    Cardiovascular: Negative for chest pain and palpitations.   Gastrointestinal: Negative for heartburn, nausea, vomiting and abdominal pain.   Genitourinary: Negative for dysuria.        Urinary incontinence   Skin: Negative for rash.   Neurological: Negative for dizziness and headaches.   Psychiatric/Behavioral: Positive for memory loss.   Past Medical History  Diagnosis Date  . Vitamin D deficiency   . Chronic lung disease   . Thrombocytopenia   . Anemia   . Dementia   . Glaucoma     chronic angle closure  . Cataract   . Osteoporosis   . Weight loss   . Ulcer     Pressure Ulcers on various body areas  . Alzheimer's disease   . Unspecified constipation   . Incomplete bladder emptying   . Injury to tibial vessel(s), unspecified   . Unspecified vitamin B deficiency   . Pressure ulcer, unspecified site(707.00)   . Depressive disorder, not elsewhere classified   . Unspecified cataract   . Malignant neoplasm of prostate   . Impotence of organic origin   . Alzheimer's disease   . Chronic kidney disease, unspecified    Current Outpatient Prescriptions on File Prior to Visit  Medication Sig Dispense Refill  . calcium-vitamin D (OSCAL WITH D) 500-200 MG-UNIT per tablet Take 1 tablet by mouth 2 (two) times daily.        . Cholecalciferol (VITAMIN D PO) Take 2,000 Units by mouth daily.       Marland Kitchen donepezil (ARICEPT) 10 MG tablet Take 10 mg by mouth at bedtime.        . Ferrous  Sulfate (IRON SUPPLEMENT PO) Take 1 capsule by mouth daily.       Marland Kitchen HYDROcodone-acetaminophen (NORCO/VICODIN) 5-325 MG per tablet Take one tablet by mouth twice daily for pain  60 tablet  0  . lactulose (CHRONULAC) 10 GM/15ML solution Take 20 g by mouth daily.      . Memantine HCl ER (NAMENDA XR) 28 MG CP24 Take 1 capsule by mouth daily.      . mirtazapine (REMERON) 15 MG tablet Take 15 mg by mouth at bedtime.        . pregabalin (LYRICA) 50 MG capsule Take one tablet twice a day for neuropathy  60 capsule  5  . senna (SENOKOT) 8.6 MG tablet Take 1 tablet by mouth 2 (two) times daily.        . travoprost, benzalkonium, (TRAVATAN) 0.004 % ophthalmic solution 1 drop at bedtime.        . vitamin B-12 (CYANOCOBALAMIN) 1000 MCG tablet Take 1,000 mcg by mouth daily.         No current facility-administered medications on file prior to visit.    Physical exam BP 118/68  Pulse 72  Temp(Src) 97.9 F (36.6 C)  Resp 18  SpO2 97%  Constitutional:  Elderly , thin, in NAD   HENT:  Head: Normocephalic and atraumatic.   Mouth/Throat: Oropharynx is clear and moist.  Poor upper dentures, missing lower ones.  Eyes: Conjunctivae are normal. Pupils are equal, round, and reactive to light.   Neck: Normal range of motion. Neck supple.  Musculoskeletal: Normal range of motion. He exhibits no edema.  Amputated 5th right finger at PIP joint, bunions b/l  Neurological: He is alert.   Skin: Skin is warm and dry.  Psychiatric: He has a normal mood and affect. His behavior is normal.   Labs reviewed:  08/11/13 wbc 3.9, hb 12.2, hct 39.4, plt 125, na 143, k 3.9, bun 25, cr 1.4, glu 105, ca 9.5, tsh 1.267  Assessment/Plan  Dementia- continue donepezil and namenda. Frequent cueing and assistance required. Fall precautions and skin care  Iron def anemia- continue iron supplement  Protein calorie malnutrition- stable. Weight also has been stable and pt is more OOB and about. Continue mechanical soft diet.  Can stop remeron  Neuropathic leg pain- continue lyrica and prn norco  Allergic rhinitis- resolved

## 2014-01-18 ENCOUNTER — Other Ambulatory Visit: Payer: Self-pay | Admitting: *Deleted

## 2014-01-18 MED ORDER — PREGABALIN 50 MG PO CAPS: ORAL_CAPSULE | ORAL | Status: DC

## 2014-01-18 NOTE — Telephone Encounter (Signed)
[facility] 

## 2014-01-21 ENCOUNTER — Other Ambulatory Visit: Payer: Self-pay | Admitting: *Deleted

## 2014-01-21 MED ORDER — HYDROCODONE-ACETAMINOPHEN 5-325 MG PO TABS: ORAL_TABLET | ORAL | Status: DC

## 2014-01-21 NOTE — Telephone Encounter (Signed)
[facility] 

## 2014-02-22 ENCOUNTER — Other Ambulatory Visit: Payer: Self-pay | Admitting: *Deleted

## 2014-02-22 MED ORDER — HYDROCODONE-ACETAMINOPHEN 5-325 MG PO TABS: ORAL_TABLET | ORAL | Status: DC

## 2014-02-22 NOTE — Telephone Encounter (Signed)
[facility] 

## 2014-03-08 ENCOUNTER — Non-Acute Institutional Stay (SKILLED_NURSING_FACILITY): Payer: PRIVATE HEALTH INSURANCE | Admitting: Internal Medicine

## 2014-03-08 ENCOUNTER — Encounter: Payer: Self-pay | Admitting: Internal Medicine

## 2014-03-08 DIAGNOSIS — G309 Alzheimer's disease, unspecified: Secondary | ICD-10-CM

## 2014-03-08 DIAGNOSIS — F0280 Dementia in other diseases classified elsewhere without behavioral disturbance: Secondary | ICD-10-CM

## 2014-03-08 DIAGNOSIS — F0393 Unspecified dementia, unspecified severity, with mood disturbance: Secondary | ICD-10-CM

## 2014-03-08 DIAGNOSIS — F329 Major depressive disorder, single episode, unspecified: Secondary | ICD-10-CM

## 2014-03-08 DIAGNOSIS — L8993 Pressure ulcer of unspecified site, stage 3: Secondary | ICD-10-CM

## 2014-03-08 DIAGNOSIS — F3289 Other specified depressive episodes: Secondary | ICD-10-CM

## 2014-03-08 DIAGNOSIS — G5790 Unspecified mononeuropathy of unspecified lower limb: Secondary | ICD-10-CM

## 2014-03-08 DIAGNOSIS — G5793 Unspecified mononeuropathy of bilateral lower limbs: Secondary | ICD-10-CM | POA: Insufficient documentation

## 2014-03-08 DIAGNOSIS — L8990 Pressure ulcer of unspecified site, unspecified stage: Secondary | ICD-10-CM

## 2014-03-08 NOTE — Progress Notes (Signed)
Patient ID: [name], male   DOB: 09/23/1926, 80 y.o.   MRN: [id number]    No Known Allergies  [facility]  Code status: full code  Chief complaint- medical management of chronic illness  HPI 80 y/o male patient is seen today for routine visit. He has dementia and is at his baseline.He has been eating well and getting OOB. Weight remains stable. No falls, sores or behavior changes reported. no new concerns from staff.  Review of Systems   Constitutional: Negative for fever and chills.   Respiratory: Negative for cough and shortness of breath.    Cardiovascular: Negative for chest pain and palpitations.   Gastrointestinal: Negative for heartburn, nausea, vomiting and abdominal pain.   Genitourinary: Negative for dysuria.  Urinary incontinence   Skin: Negative for rash.   Neurological: Negative for dizziness and headaches.   Psychiatric/Behavioral: dementia/ memory loss  Past Medical History  Diagnosis Date  . Vitamin D deficiency   . Chronic lung disease   . Thrombocytopenia   . Anemia   . Dementia   . Glaucoma     chronic angle closure  . Cataract   . Osteoporosis   . Weight loss   . Ulcer     Pressure Ulcers on various body areas  . Alzheimer's disease   . Unspecified constipation   . Incomplete bladder emptying   . Injury to tibial vessel(s), unspecified   . Unspecified vitamin B deficiency   . Pressure ulcer, unspecified site(707.00)   . Depressive disorder, not elsewhere classified   . Unspecified cataract   . Malignant neoplasm of prostate   . Impotence of organic origin   . Alzheimer's disease   . Chronic kidney disease, unspecified    Current Outpatient Prescriptions on File Prior to Visit  Medication Sig Dispense Refill  . calcium-vitamin D (OSCAL WITH D) 500-200 MG-UNIT per tablet Take 1 tablet by mouth 2 (two) times daily.        . Cholecalciferol (VITAMIN D PO) Take 2,000 Units by mouth daily.       Marland Kitchen donepezil (ARICEPT) 10 MG  tablet Take 10 mg by mouth at bedtime.        . Ferrous Sulfate (IRON SUPPLEMENT PO) Take 1 capsule by mouth daily.       Marland Kitchen HYDROcodone-acetaminophen (NORCO/VICODIN) 5-325 MG per tablet Take one tablet by mouth twice daily for pain  60 tablet  0  . Memantine HCl ER (NAMENDA XR) 28 MG CP24 Take 1 capsule by mouth daily.      . pregabalin (LYRICA) 50 MG capsule Take one tablet twice a day for neuropathy  60 capsule  5  . senna (SENOKOT) 8.6 MG tablet Take 1 tablet by mouth 2 (two) times daily.        . travoprost, benzalkonium, (TRAVATAN) 0.004 % ophthalmic solution 1 drop at bedtime.         No current facility-administered medications on file prior to visit.   Past Surgical History  Procedure Laterality Date  . Eye surgery  cataract surgery bilateral   2012  . Aortogram  08-23-2011    Physical exam BP 128/66  Pulse 65  Temp(Src) 98 F (36.7 C)  Resp 17  SpO2 95%  Constitutional: Elderly , thin, in NAD   HENT:   Head: Normocephalic and atraumatic.   Mouth/Throat: Oropharynx is clear and moist.  Poor upper dentures, missing lower ones. Runny nose with clear watery discharge Eyes: Conjunctivae are normal. Pupils are equal,  round, and reactive to light.   Neck: Normal range of motion. Neck supple.  Musculoskeletal: Normal range of motion. He exhibits no edema.  Amputated 5th right finger at PIP joint, bunions b/l  Neurological: He is alert.   Skin: Skin is warm and dry. 5th toe pressure ulcer, stage 3 Psychiatric: He has a normal mood and affect. His behavior is normal.   Labs reviewed:  08/11/13 wbc 3.9, hb 12.2, hct 39.4, plt 125, na 143, k 3.9, bun 25, cr 1.4, glu 105, ca 9.5, tsh 1.267 02/03/14 wbc 6.5, hb 12.5, hct 39.3, plt 123  Assessment/Plan  Pressure ulcer stage 3 In 5th toe, improving, followed by wound nurse with dressing change. Pressure ulcer prophylaxis  Depression Stable. Off remeron. Monitor clinically  Dementia continue donepezil and namenda. Frequent  cueing and assistance required. Fall precautions and skin care  Neuropathic leg pain continue lyrica and prn norco

## 2014-03-24 ENCOUNTER — Other Ambulatory Visit: Payer: Self-pay | Admitting: *Deleted

## 2014-03-24 MED ORDER — HYDROCODONE-ACETAMINOPHEN 5-325 MG PO TABS: ORAL_TABLET | ORAL | Status: DC

## 2014-03-24 NOTE — Telephone Encounter (Signed)
[facility] 

## 2014-04-15 ENCOUNTER — Non-Acute Institutional Stay (SKILLED_NURSING_FACILITY): Payer: PRIVATE HEALTH INSURANCE | Admitting: Internal Medicine

## 2014-04-15 DIAGNOSIS — I739 Peripheral vascular disease, unspecified: Secondary | ICD-10-CM

## 2014-04-15 DIAGNOSIS — L8993 Pressure ulcer of unspecified site, stage 3: Secondary | ICD-10-CM

## 2014-04-15 DIAGNOSIS — F0390 Unspecified dementia without behavioral disturbance: Secondary | ICD-10-CM

## 2014-04-15 DIAGNOSIS — D509 Iron deficiency anemia, unspecified: Secondary | ICD-10-CM

## 2014-04-15 NOTE — Progress Notes (Signed)
Patient ID: [name], male   DOB: [birth year], 80 y.o.   MRN: [id number]    No Known Allergies  [facility]  Code status: full code  Chief complaint- medical management of chronic illness  HPI 80 y/o male patient is seen today for routine visit. He has dementia and is at his baseline.He has been eating well and getting OOB. Weight remains stable. No falls, sores or behavior changes reported.   Review of Systems   Constitutional: Negative for fever and chills.   Respiratory: Negative for cough and shortness of breath.    Cardiovascular: Negative for chest pain and palpitations.   Gastrointestinal: Negative for heartburn, nausea, vomiting and abdominal pain.   Genitourinary: Negative for dysuria.  Urinary incontinence   Skin: Negative for rash.   Neurological: Negative for dizziness and headaches.   Psychiatric/Behavioral: dementia/ memory loss    Past Medical History  Diagnosis Date  . Vitamin D deficiency   . Chronic lung disease   . Thrombocytopenia   . Anemia   . Dementia   . Glaucoma     chronic angle closure  . Cataract   . Osteoporosis   . Weight loss   . Ulcer     Pressure Ulcers on various body areas  . Alzheimer's disease   . Unspecified constipation   . Incomplete bladder emptying   . Injury to tibial vessel(s), unspecified   . Unspecified vitamin B deficiency   . Pressure ulcer, unspecified site(707.00)   . Depressive disorder, not elsewhere classified   . Unspecified cataract   . Malignant neoplasm of prostate   . Impotence of organic origin   . Alzheimer's disease   . Chronic kidney disease, unspecified    Medication reviewed. See [facility]  Physical exam BP 140/70  Pulse 70  Temp(Src) 96.7 F (35.9 C)  Resp 18  Wt 158 lb (71.668 kg)  SpO2 97%  Constitutional: Elderly , thin, in NAD   HENT:   Head: Normocephalic and atraumatic.   Mouth/Throat: Oropharynx is clear and moist.  Poor upper dentures, missing lower ones. Runny nose  with clear watery discharge Eyes: Conjunctivae are normal. Pupils are equal, round, and reactive to light.   Neck: Normal range of motion. Neck supple.  Musculoskeletal: Normal range of motion. He exhibits no edema.  Amputated 5th right finger at PIP joint, bunions b/l  Neurological: He is alert.   Skin: Skin is warm and dry. 5th toe pressure ulcer, stage 3 Psychiatric: He has a normal mood and affect. His behavior is normal.   Labs reviewed:  08/11/13 wbc 3.9, hb 12.2, hct 39.4, plt 125, na 143, k 3.9, bun 25, cr 1.4, glu 105, ca 9.5, tsh 1.267 02/03/14 wbc 6.5, hb 12.5, hct 39.3, plt 123  Assessment/Plan  Pressure ulcer stage 3 In 5th toe, improving, followed by wound nurse with dressing change. Pressure ulcer prophylaxis. Continue prn norco  PVD With ongoing ulcers, pain management, wound care and pressure ulcer prophylaxis for now  Dementia continue donepezil and namenda. Frequent cueing and assistance required. Fall precautions and skin care  Iron def anemia Continue iron supplement and monitor h/h

## 2014-05-03 ENCOUNTER — Non-Acute Institutional Stay (SKILLED_NURSING_FACILITY): Payer: PRIVATE HEALTH INSURANCE | Admitting: Internal Medicine

## 2014-05-03 DIAGNOSIS — H409 Unspecified glaucoma: Secondary | ICD-10-CM

## 2014-05-03 DIAGNOSIS — IMO0002 Reserved for concepts with insufficient information to code with codable children: Secondary | ICD-10-CM

## 2014-05-03 DIAGNOSIS — M1990 Unspecified osteoarthritis, unspecified site: Secondary | ICD-10-CM

## 2014-05-03 DIAGNOSIS — M792 Neuralgia and neuritis, unspecified: Secondary | ICD-10-CM

## 2014-05-03 DIAGNOSIS — K5900 Constipation, unspecified: Secondary | ICD-10-CM

## 2014-05-13 LAB — HEPATIC FUNCTION PANEL
ALT: 10 U/L (ref 10–40)
AST: 14 U/L (ref 14–40)
Bilirubin, Total: 0.4 mg/dL

## 2014-05-13 LAB — BASIC METABOLIC PANEL
BUN: 27 mg/dL — AB (ref 4–21)
Glucose: 74 mg/dL
POTASSIUM: 4.2 mmol/L (ref 3.4–5.3)
SODIUM: 138 mmol/L (ref 137–147)

## 2014-05-19 LAB — CBC AND DIFFERENTIAL
HCT: 35 % — AB (ref 41–53)
Hemoglobin: 10.9 g/dL — AB (ref 13.5–17.5)
Platelets: 121 10*3/uL — AB (ref 150–399)
WBC: 5.8 10^3/mL

## 2014-05-24 ENCOUNTER — Other Ambulatory Visit: Payer: Self-pay | Admitting: *Deleted

## 2014-05-24 MED ORDER — HYDROCODONE-ACETAMINOPHEN 5-325 MG PO TABS: ORAL_TABLET | ORAL | Status: DC

## 2014-05-24 NOTE — Telephone Encounter (Signed)
[facility] 

## 2014-05-31 ENCOUNTER — Non-Acute Institutional Stay (SKILLED_NURSING_FACILITY): Payer: PRIVATE HEALTH INSURANCE | Admitting: Internal Medicine

## 2014-05-31 DIAGNOSIS — J4489 Other specified chronic obstructive pulmonary disease: Secondary | ICD-10-CM | POA: Insufficient documentation

## 2014-05-31 DIAGNOSIS — H409 Unspecified glaucoma: Secondary | ICD-10-CM | POA: Insufficient documentation

## 2014-05-31 DIAGNOSIS — J449 Chronic obstructive pulmonary disease, unspecified: Secondary | ICD-10-CM | POA: Insufficient documentation

## 2014-05-31 DIAGNOSIS — M792 Neuralgia and neuritis, unspecified: Secondary | ICD-10-CM | POA: Insufficient documentation

## 2014-05-31 DIAGNOSIS — F0390 Unspecified dementia without behavioral disturbance: Secondary | ICD-10-CM

## 2014-05-31 DIAGNOSIS — D6949 Other primary thrombocytopenia: Secondary | ICD-10-CM | POA: Insufficient documentation

## 2014-05-31 DIAGNOSIS — M1990 Unspecified osteoarthritis, unspecified site: Secondary | ICD-10-CM | POA: Insufficient documentation

## 2014-05-31 NOTE — Progress Notes (Signed)
Patient ID: [name], male   DOB: 1926-11-18, 80 y.o.   MRN: [id number]    Facility: [facility]  Chief Complaint  Patient presents with  . Medical Management of Chronic Issues   No Known Allergies  Code status: full code  HPI 80 y/o male patient is seen today for routine visit. No falls, sores or behavior changes reported.   Review of Systems   Constitutional: Negative for fever and chills.   Respiratory: Negative for cough and shortness of breath.    Cardiovascular: Negative for chest pain and palpitations.   Gastrointestinal: Negative for heartburn, nausea, vomiting and abdominal pain.   Genitourinary: Negative for dysuria.  Urinary incontinence   Skin: Negative for rash.   Neurological: Negative for dizziness and headaches.   Psychiatric/Behavioral: dementia/ memory loss    Past Medical History  Diagnosis Date  . Vitamin D deficiency   . Chronic lung disease   . Thrombocytopenia   . Anemia   . Dementia   . Glaucoma     chronic angle closure  . Cataract   . Osteoporosis   . Weight loss   . Ulcer     Pressure Ulcers on various body areas  . Alzheimer's disease   . Unspecified constipation   . Incomplete bladder emptying   . Injury to tibial vessel(s), unspecified   . Unspecified vitamin B deficiency   . Pressure ulcer, unspecified site(707.00)   . Depressive disorder, not elsewhere classified   . Unspecified cataract   . Malignant neoplasm of prostate   . Impotence of organic origin   . Alzheimer's disease   . Chronic kidney disease, unspecified    Medication reviewed. See [facility]  Physical exam Vital signs stable, afebrile  Constitutional: Elderly , thin, in NAD   HENT:   Head: Normocephalic and atraumatic.   Mouth/Throat: Oropharynx is clear and moist.  Poor upper dentures, missing lower ones. Runny nose with clear watery discharge Eyes: Conjunctivae are normal. Pupils are equal, round, and reactive to  light.   Neck: Normal range of motion. Neck supple.  Musculoskeletal: Normal range of motion. He exhibits no edema.  Amputated 5th right finger at PIP joint, bunions b/l  Neurological: He is alert.   Skin: Skin is warm and dry. 5th toe pressure ulcer, stage 3 Psychiatric: He has a normal mood and affect. His behavior is normal.   Labs reviewed:  08/11/13 wbc 3.9, hb 12.2, hct 39.4, plt 125, na 143, k 3.9, bun 25, cr 1.4, glu 105, ca 9.5, tsh 1.267 02/03/14 wbc 6.5, hb 12.5, hct 39.3, plt 123  Assessment/Plan  Senile dementia Continue namenda 10 mg bid with aricept for now.   Thrombocytopenia Stable, monitor clinically. Avoid aspirin, plavix  Copd Off o2, has not required any prn nebs. Monitor clinically

## 2014-05-31 NOTE — Progress Notes (Signed)
Patient ID: [name], male   DOB: 1926-07-21, 80 y.o.   MRN: [id number]    No Known Allergies  [facility]  Code status: full code  Chief complaint- medical management of chronic illness  HPI 80 y/o male patient is seen today for routine visit. Patient has dementia and no new concerns. No concern from staff this visit.   Review of Systems   Constitutional: Negative for fever and chills.   Respiratory: Negative for cough and shortness of breath.    Cardiovascular: Negative for chest pain and palpitations.   Skin: Negative for rash.   Psychiatric/Behavioral: dementia/ memory loss    Past Medical History  Diagnosis Date  . Vitamin D deficiency   . Chronic lung disease   . Thrombocytopenia   . Anemia   . Dementia   . Glaucoma     chronic angle closure  . Cataract   . Osteoporosis   . Weight loss   . Ulcer     Pressure Ulcers on various body areas  . Alzheimer's disease   . Unspecified constipation   . Incomplete bladder emptying   . Injury to tibial vessel(s), unspecified   . Unspecified vitamin B deficiency   . Pressure ulcer, unspecified site(707.00)   . Depressive disorder, not elsewhere classified   . Unspecified cataract   . Malignant neoplasm of prostate   . Impotence of organic origin   . Alzheimer's disease   . Chronic kidney disease, unspecified    Medication reviewed  Physical exam Constitutional: Elderly , thin, in NAD   Head: Normocephalic and atraumatic.   Eyes: Conjunctivae are normal. Pupils are equal, round, and reactive to light.   Musculoskeletal: Normal range of motion. He exhibits no edema.  Amputated 5th right finger at PIP joint, bunions b/l  Skin: Skin is warm and dry. 5th toe pressure ulcer, stage 3 Psychiatric: He has a normal mood and affect. His behavior is normal.   Labs reviewed:  08/11/13 wbc 3.9, hb 12.2, hct 39.4, plt 125, na 143, k 3.9, bun 25, cr 1.4, glu 105, ca 9.5, tsh 1.267 02/03/14 wbc 6.5, hb 12.5, hct  39.3, plt 123  Assessment/Plan  Glaucoma Continue travatan and combigan eye drop  Constipation Continue senna s  Peripheral neuropathy Continue his lyrica and norco bid for now  OA Continue oscal with vitamin d supplement. Also on norco 5/325 1 tab bid

## 2014-06-23 ENCOUNTER — Other Ambulatory Visit: Payer: Self-pay | Admitting: *Deleted

## 2014-06-23 MED ORDER — HYDROCODONE-ACETAMINOPHEN 5-325 MG PO TABS: ORAL_TABLET | ORAL | Status: DC

## 2014-06-23 NOTE — Telephone Encounter (Signed)
[facility] 

## 2014-07-15 ENCOUNTER — Non-Acute Institutional Stay (SKILLED_NURSING_FACILITY): Payer: PRIVATE HEALTH INSURANCE | Admitting: Internal Medicine

## 2014-07-15 DIAGNOSIS — F325 Major depressive disorder, single episode, in full remission: Secondary | ICD-10-CM

## 2014-07-15 DIAGNOSIS — M810 Age-related osteoporosis without current pathological fracture: Secondary | ICD-10-CM

## 2014-07-15 DIAGNOSIS — H4011X0 Primary open-angle glaucoma, stage unspecified: Secondary | ICD-10-CM

## 2014-07-15 DIAGNOSIS — H401190 Primary open-angle glaucoma, unspecified eye, stage unspecified: Secondary | ICD-10-CM | POA: Insufficient documentation

## 2014-07-15 DIAGNOSIS — G909 Disorder of the autonomic nervous system, unspecified: Secondary | ICD-10-CM

## 2014-07-15 DIAGNOSIS — I739 Peripheral vascular disease, unspecified: Secondary | ICD-10-CM

## 2014-07-15 DIAGNOSIS — D518 Other vitamin B12 deficiency anemias: Secondary | ICD-10-CM

## 2014-07-15 DIAGNOSIS — G990 Autonomic neuropathy in diseases classified elsewhere: Secondary | ICD-10-CM | POA: Insufficient documentation

## 2014-07-15 NOTE — Progress Notes (Signed)
Patient ID: [name], male   DOB: [birth year], 80 y.o.   MRN: [id number]    Facility: [facility]  No Known Allergies  Code status: full code  HPI 80 y/o male patient is seen today for routine visit. He is at his baseline. He has osteoporosis, glaucoma. Depression, PVD ad neuropathy. He has confusion at baseline but can converse his needs. No falls, sores or behavior changes reported. No new concern from staff  Review of Systems   Constitutional: Negative for fever and chills.   Respiratory: Negative for cough and shortness of breath.    Cardiovascular: Negative for chest pain and palpitations.   Gastrointestinal: Negative for heartburn, nausea, vomiting and abdominal pain.   Genitourinary: Negative for dysuria.  Urinary incontinence   Skin: Negative for rash.   Neurological: Negative for dizziness and headaches.   Psychiatric/Behavioral: dementia/ memory loss  Past Medical History  Diagnosis Date  . Vitamin D deficiency   . Chronic lung disease   . Thrombocytopenia   . Anemia   . Dementia   . Glaucoma     chronic angle closure  . Cataract   . Osteoporosis   . Weight loss   . Ulcer     Pressure Ulcers on various body areas  . Alzheimer's disease   . Unspecified constipation   . Incomplete bladder emptying   . Injury to tibial vessel(s), unspecified   . Unspecified vitamin B deficiency   . Pressure ulcer, unspecified site(707.00)   . Depressive disorder, not elsewhere classified   . Unspecified cataract   . Malignant neoplasm of prostate   . Impotence of organic origin   . Alzheimer's disease   . Chronic kidney disease, unspecified    Current Outpatient Prescriptions on File Prior to Visit  Medication Sig Dispense Refill  . calcium-vitamin D (OSCAL WITH D) 500-200 MG-UNIT per tablet Take 1 tablet by mouth 2 (two) times daily.        . Cholecalciferol (VITAMIN D PO) Take 2,000 Units by mouth daily.       Marland Kitchen donepezil  (ARICEPT) 10 MG tablet Take 10 mg by mouth at bedtime.        . Ferrous Sulfate (IRON SUPPLEMENT PO) Take 1 capsule by mouth daily.       Marland Kitchen HYDROcodone-acetaminophen (NORCO/VICODIN) 5-325 MG per tablet Take one tablet by mouth twice daily for pain  60 tablet  0  . Memantine HCl ER (NAMENDA XR) 28 MG CP24 Take 1 capsule by mouth daily.      . pregabalin (LYRICA) 50 MG capsule Take one tablet twice a day for neuropathy  60 capsule  5  . senna (SENOKOT) 8.6 MG tablet Take 1 tablet by mouth 2 (two) times daily.        . travoprost, benzalkonium, (TRAVATAN) 0.004 % ophthalmic solution 1 drop at bedtime.        . vitamin C (ASCORBIC ACID) 500 MG tablet Take 500 mg by mouth 2 (two) times daily.      Marland Kitchen zinc sulfate 220 MG capsule Take 220 mg by mouth 2 (two) times daily.       No current facility-administered medications on file prior to visit.   Physical exam BP 131/88  Pulse 60  Temp(Src) 96.6 F (35.9 C)  Resp 18  SpO2 95%  Constitutional: Elderly , thin, in NAD   HENT:   Head: Normocephalic and atraumatic.   Mouth/Throat: Oropharynx is clear and moist.  Poor  upper dentures, missing lower ones. Runny nose with clear watery discharge Eyes: Conjunctivae are normal. Pupils are equal, round, and reactive to light.   Neck: Normal range of motion. Neck supple.  Cardiac: regular rate and rhythm, no murmur, diminished pedal pulses Respi: CTAB, no wheeze or crackles or rhonchi Musculoskeletal: Normal range of motion. He exhibits no edema.  Amputated 5th right finger at PIP joint, bunions b/l  Neurological: He is alert.   Skin: Skin is warm and dry. 5th toe pressure ulcer, stage 3 Psychiatric: He has a normal mood and affect. His behavior is normal.   Labs reviewed:  08/11/13 wbc 3.9, hb 12.2, hct 39.4, plt 125, na 143, k 3.9, bun 25, cr 1.4, glu 105, ca 9.5, tsh 1.267 02/03/14 wbc 6.5, hb 12.5, hct 39.3, plt 123   Assessment/Plan  Open angle glaucoma Continue his combigan and travatan ey  drops  Depression Off all medication and currently stable. Monitor mood  Osteoporosis Continue his calcium carbonate and vit d supplement  Peripheral autonomic neuropathy Continue lyrica which has been helpful. Adjust meds if needed  b12 def Continue b12 supplement. Monitor his neuropathy. Fall precautions  PVD No wounds currently. Monitor for open sores/ wounds. Skin care

## 2014-07-23 ENCOUNTER — Other Ambulatory Visit: Payer: Self-pay | Admitting: *Deleted

## 2014-07-23 MED ORDER — PREGABALIN 50 MG PO CAPS: ORAL_CAPSULE | ORAL | Status: DC

## 2014-07-23 MED ORDER — HYDROCODONE-ACETAMINOPHEN 5-325 MG PO TABS: ORAL_TABLET | ORAL | Status: DC

## 2014-07-23 NOTE — Telephone Encounter (Signed)
[facility] 

## 2014-08-09 ENCOUNTER — Non-Acute Institutional Stay (SKILLED_NURSING_FACILITY): Payer: PRIVATE HEALTH INSURANCE | Admitting: Internal Medicine

## 2014-08-09 DIAGNOSIS — F0390 Unspecified dementia without behavioral disturbance: Secondary | ICD-10-CM

## 2014-08-09 DIAGNOSIS — E440 Moderate protein-calorie malnutrition: Secondary | ICD-10-CM

## 2014-08-09 DIAGNOSIS — N183 Chronic kidney disease, stage 3 unspecified: Secondary | ICD-10-CM

## 2014-08-09 DIAGNOSIS — J449 Chronic obstructive pulmonary disease, unspecified: Secondary | ICD-10-CM

## 2014-08-09 NOTE — Progress Notes (Signed)
Patient ID: [name], male   DOB: [birth year], 80 y.o.   MRN: [id number]  Location:  [place]  Provider:   Blanchie Serve, MD   Code Status:  Full  Chief Complaint  Patient presents with  . Medical Management of Chronic Issues    HPI:  80 y/o male patient is seen today for routine visit. He has been at his baseline. No new concerns  Review of Systems   Constitutional: Negative for fever and chills.   Respiratory: Negative for cough and shortness of breath.    Cardiovascular: Negative for chest pain and palpitations.   Gastrointestinal: Negative for heartburn, nausea, vomiting and abdominal pain.   Genitourinary: Negative for dysuria.  Urinary incontinence   Skin: Negative for rash.   Neurological: Negative for dizziness and headaches.   Psychiatric/Behavioral: dementia/ memory loss     Medications: Patient's Medications  New Prescriptions   No medications on file  Previous Medications   BRIMONIDINE-TIMOLOL (COMBIGAN) 0.2-0.5 % OPHTHALMIC SOLUTION    Place 1 drop into both eyes every 12 (twelve) hours.   CALCIUM-VITAMIN D (OSCAL WITH D) 500-200 MG-UNIT PER TABLET    Take 1 tablet by mouth 2 (two) times daily.     CHOLECALCIFEROL (VITAMIN D PO)    Take 2,000 Units by mouth daily.    DONEPEZIL (ARICEPT) 10 MG TABLET    Take 10 mg by mouth at bedtime.     FERROUS SULFATE (IRON SUPPLEMENT PO)    Take 1 capsule by mouth daily.    HYDROCODONE-ACETAMINOPHEN (NORCO/VICODIN) 5-325 MG PER TABLET    Take one tablet by mouth twice daily for pain   MEMANTINE HCL ER (NAMENDA XR) 28 MG CP24    Take 1 capsule by mouth daily. For dementia   POLLEN EXTRACTS (PROSTAT PO)    Take 30 mLs by mouth 2 (two) times daily.   PREGABALIN (LYRICA) 50 MG CAPSULE    Take one tablet twice a day for neuropathy   SENNA (SENOKOT) 8.6 MG TABLET    Take 1 tablet by mouth 2 (two) times daily.     TRAVOPROST, BENZALKONIUM, (TRAVATAN) 0.004 % OPHTHALMIC SOLUTION    1 drop at bedtime.     VITAMIN C  (ASCORBIC ACID) 500 MG TABLET    Take 500 mg by mouth 2 (two) times daily.   ZINC SULFATE 220 MG CAPSULE    Take 220 mg by mouth 2 (two) times daily.  Modified Medications   No medications on file  Discontinued Medications   No medications on file    Physical Exam: Filed Vitals:   08/09/14 1535  BP: 132/84  Pulse: 72  Temp: 97.4 F (36.3 C)  Resp: 20  Height: 5\' 4"  (1.626 m)   Constitutional: Elderly , thin, in NAD   HENT:   Head: Normocephalic and atraumatic.   Mouth/Throat: Oropharynx is clear and moist.  Eyes: Conjunctivae are normal. Pupils are equal, round, and reactive to light.   Neck: Normal range of motion. Neck supple.  Musculoskeletal: Normal range of motion. He exhibits no edema.  Amputated 5th right finger at PIP joint, bunions b/l  Neurological: He is alert.   Skin: Skin is warm and dry.  Psychiatric: He has a normal mood and affect. His behavior is normal.   Labs reviewed: Basic Metabolic Panel:  Recent Labs  05/13/14  NA 138  K 4.2  BUN 27*    Liver Function Tests:  Recent Labs  05/13/14  AST 14  ALT 10  CBC:  Recent Labs  05/19/14  WBC 5.8  HGB 10.9*  HCT 35*  PLT 121*    Assessment/Plan  Senile dementia Continue his aricept and namenda for now. Monitor weight and skin care to be continued  Copd Continue bronchodilator treatment for now  ckd stage 3 Monitor renal function, avoid nephrotoxic agents  Malnutrition stable for now, decline anticipated with his dementia. Continue supplements

## 2014-08-12 DIAGNOSIS — N183 Chronic kidney disease, stage 3 unspecified: Secondary | ICD-10-CM | POA: Insufficient documentation

## 2014-08-12 DIAGNOSIS — E440 Moderate protein-calorie malnutrition: Secondary | ICD-10-CM | POA: Insufficient documentation

## 2014-08-24 ENCOUNTER — Other Ambulatory Visit: Payer: Self-pay | Admitting: *Deleted

## 2014-08-24 MED ORDER — HYDROCODONE-ACETAMINOPHEN 5-325 MG PO TABS: ORAL_TABLET | ORAL | Status: DC

## 2014-08-24 MED ORDER — PREGABALIN 50 MG PO CAPS: ORAL_CAPSULE | ORAL | Status: DC

## 2014-08-24 NOTE — Telephone Encounter (Signed)
[facility] 

## 2014-09-09 ENCOUNTER — Encounter: Payer: Self-pay | Admitting: Internal Medicine

## 2014-09-09 ENCOUNTER — Non-Acute Institutional Stay (SKILLED_NURSING_FACILITY): Payer: PRIVATE HEALTH INSURANCE | Admitting: Internal Medicine

## 2014-09-09 DIAGNOSIS — R1319 Other dysphagia: Secondary | ICD-10-CM

## 2014-09-09 DIAGNOSIS — K5900 Constipation, unspecified: Secondary | ICD-10-CM

## 2014-09-09 DIAGNOSIS — F0390 Unspecified dementia without behavioral disturbance: Secondary | ICD-10-CM

## 2014-09-09 DIAGNOSIS — L8993 Pressure ulcer of unspecified site, stage 3: Secondary | ICD-10-CM

## 2014-09-09 DIAGNOSIS — L8990 Pressure ulcer of unspecified site, unspecified stage: Secondary | ICD-10-CM

## 2014-09-09 DIAGNOSIS — D509 Iron deficiency anemia, unspecified: Secondary | ICD-10-CM

## 2014-09-09 NOTE — Progress Notes (Signed)
Patient ID: [name], male   DOB: [birth year], 80 y.o.   MRN: [id number]    Facility: [facility]  Chief Complaint  Patient presents with  . Medical Management of Chronic Issues   No Known Allergies  HPI 80 y/o male patient is seen today for routine visit. he is in bed and appears comfortable. No new concern from patient or staff. He has dementia. No falls, sores or behavior changes reported.   Review of Systems   Constitutional: Negative for fever and chills.   Respiratory: Negative for cough and shortness of breath.    Cardiovascular: Negative for chest pain and palpitations.   Gastrointestinal: Negative for heartburn, nausea, vomiting and abdominal pain.   Genitourinary: Negative for dysuria. Has urinary incontinence   Skin: Negative for rash.   Neurological: Negative for dizziness and headaches.   Psychiatric/Behavioral: dementia/ memory loss  Past Medical History  Diagnosis Date  . Vitamin D deficiency   . Chronic lung disease   . Thrombocytopenia   . Anemia   . Dementia   . Glaucoma     chronic angle closure  . Cataract   . Osteoporosis   . Weight loss   . Ulcer     Pressure Ulcers on various body areas  . Alzheimer's disease   . Unspecified constipation   . Incomplete bladder emptying   . Injury to tibial vessel(s), unspecified   . Unspecified vitamin B deficiency   . Pressure ulcer, unspecified site(707.00)   . Depressive disorder, not elsewhere classified   . Unspecified cataract   . Malignant neoplasm of prostate   . Impotence of organic origin   . Alzheimer's disease   . Chronic kidney disease, unspecified    Current Outpatient Prescriptions on File Prior to Visit  Medication Sig Dispense Refill  . brimonidine-timolol (COMBIGAN) 0.2-0.5 % ophthalmic solution Place 1 drop into both eyes every 12 (twelve) hours.      . calcium-vitamin D (OSCAL WITH D) 500-200 MG-UNIT per tablet Take 1 tablet by mouth 2 (two)  times daily.        . Cholecalciferol (VITAMIN D PO) Take 2,000 Units by mouth daily.       Marland Kitchen donepezil (ARICEPT) 10 MG tablet Take 10 mg by mouth at bedtime.        . Ferrous Sulfate (IRON SUPPLEMENT PO) Take 1 capsule by mouth daily.       Marland Kitchen HYDROcodone-acetaminophen (NORCO/VICODIN) 5-325 MG per tablet Take one tablet by mouth twice daily for pain  60 tablet  0  . Memantine HCl ER (NAMENDA XR) 28 MG CP24 Take 1 capsule by mouth daily. For dementia      . pregabalin (LYRICA) 50 MG capsule Take one tablet twice a day for neuropathy  60 capsule  5  . senna (SENOKOT) 8.6 MG tablet Take 1 tablet by mouth 2 (two) times daily.        . travoprost, benzalkonium, (TRAVATAN) 0.004 % ophthalmic solution 1 drop at bedtime.        . vitamin C (ASCORBIC ACID) 500 MG tablet Take 500 mg by mouth 2 (two) times daily.      Marland Kitchen zinc sulfate 220 MG capsule Take 220 mg by mouth 2 (two) times daily.       No current facility-administered medications on file prior to visit.   Physical exam BP 140/78  Pulse 60  Temp(Src) 98 F (36.7 C)  Resp 18  SpO2 97%  Constitutional: Elderly ,  thin, in NAD   HENT:   Head: Normocephalic and atraumatic.   Mouth/Throat: Oropharynx is clear and moist.  Poor upper dentures, missing lower ones. Runny nose with clear watery discharge Eyes: Conjunctivae are normal. Pupils are equal, round, and reactive to light.   Neck: Normal range of motion. Neck supple.  Musculoskeletal: Normal range of motion. He exhibits no edema.  Amputated 5th right finger at PIP joint, bunions b/l  Neurological: He is alert.   Skin: Skin is warm and dry. 5th toe pressure ulcer, stage 3 Psychiatric: He has a normal mood and affect. His behavior is normal.   Labs reviewed:  08/11/13 wbc 3.9, hb 12.2, hct 39.4, plt 125, na 143, k 3.9, bun 25, cr 1.4, glu 105, ca 9.5, tsh 1.267 02/03/14 wbc 6.5, hb 12.5, hct 39.3, plt 123 05/19/14 wbc 5.8, hb 10.9, hct 35.2, plt 121  Assessment/Plan  Dysphagia Has  been put on mechanical soft diet and is tolerating this fine. Aspiration precautions  Stage 3 pressure ulcer Continue wound care. Now on vit c with zinc and prostat supplement to help with wound healing. Monitor for signs of infection  Constipation miralax has been helpful, continue this with senokot s  Senile dementia Continue namenda 10 mg bid with aricept for now.   Anemia Continue iron supplement ad monitor cbc every 3 months

## 2014-09-13 DIAGNOSIS — F0390 Unspecified dementia without behavioral disturbance: Secondary | ICD-10-CM | POA: Insufficient documentation

## 2014-09-13 DIAGNOSIS — R1319 Other dysphagia: Secondary | ICD-10-CM | POA: Insufficient documentation

## 2014-09-13 DIAGNOSIS — L8993 Pressure ulcer of unspecified site, stage 3: Secondary | ICD-10-CM | POA: Insufficient documentation

## 2014-09-27 ENCOUNTER — Other Ambulatory Visit: Payer: Self-pay | Admitting: *Deleted

## 2014-09-27 MED ORDER — HYDROCODONE-ACETAMINOPHEN 5-325 MG PO TABS: ORAL_TABLET | ORAL | Status: DC

## 2014-09-27 NOTE — Telephone Encounter (Signed)
[facility] 

## 2014-10-11 ENCOUNTER — Encounter: Payer: Self-pay | Admitting: Internal Medicine

## 2014-10-11 ENCOUNTER — Non-Acute Institutional Stay (SKILLED_NURSING_FACILITY): Payer: PRIVATE HEALTH INSURANCE | Admitting: Internal Medicine

## 2014-10-11 DIAGNOSIS — K5901 Slow transit constipation: Secondary | ICD-10-CM

## 2014-10-11 DIAGNOSIS — F0280 Dementia in other diseases classified elsewhere without behavioral disturbance: Secondary | ICD-10-CM

## 2014-10-11 DIAGNOSIS — G5792 Unspecified mononeuropathy of left lower limb: Secondary | ICD-10-CM

## 2014-10-11 DIAGNOSIS — G5791 Unspecified mononeuropathy of right lower limb: Secondary | ICD-10-CM

## 2014-10-11 DIAGNOSIS — G309 Alzheimer's disease, unspecified: Secondary | ICD-10-CM

## 2014-10-11 DIAGNOSIS — G5793 Unspecified mononeuropathy of bilateral lower limbs: Secondary | ICD-10-CM

## 2014-10-11 DIAGNOSIS — L8993 Pressure ulcer of unspecified site, stage 3: Secondary | ICD-10-CM

## 2014-10-11 NOTE — Progress Notes (Signed)
Patient ID: [name], male   DOB: 08-25-1926, 80 y.o.   MRN: [id number]   Place of Service: [facility]  No Known Allergies  Code Status: Full Code  Goals of Care: Longevity/Long term care  Chief Complaint  Patient presents with  . Medical Management of Chronic Issues    AD, chronic pain, constipation,  stage 3 PU of R fifth toe    HPI 80 y.o. male with PMH of Alzheimer's disease, chronic neuropathic pain, chronic constipation, healing stage 3 pressure ulcer of right fifth toe among others is being seen for a routine visit. No falls or change in functional status reported. Weight stable. No change in appetite or bowel habits. No change in behaviors. No new concerns from nursing staff.   Review of Systems (limited) Constitutional: Negative for fever and chills. HENT: Negative for congestion and sore throat Eyes: Negative for eye discharge, and visual disturbance  Cardiovascular: Negative for chest pain, palpitations, and leg swelling Respiratory: Negative cough and shortness of breath Gastrointestinal: Negative for abdominal pain, diarrhea. Positive for constipation  Musculoskeletal: Negative for back pain, joint pain, and joint swelling  Neurological: Negative for dizziness, headache, Skin: Negative for rash and wound.   Psychiatric: has dementia as baseline   Past Medical History  Diagnosis Date  . Vitamin D deficiency   . Chronic lung disease   . Thrombocytopenia   . Anemia   . Dementia   . Glaucoma     chronic angle closure  . Cataract   . Osteoporosis   . Weight loss   . Ulcer     Pressure Ulcers on various body areas  . Alzheimer's disease   . Unspecified constipation   . Incomplete bladder emptying   . Injury to tibial vessel(s), unspecified   . Unspecified vitamin B deficiency   . Pressure ulcer, unspecified site(707.00)   . Depressive disorder, not elsewhere classified   . Unspecified cataract   . Malignant neoplasm of prostate     . Impotence of organic origin   . Alzheimer's disease   . Chronic kidney disease, unspecified     Past Surgical History  Procedure Laterality Date  . Eye surgery  cataract surgery bilateral   2012  . Aortogram  08-23-2011    History   Social History  . Marital Status: Single    Spouse Name: N/A    Number of Children: N/A  . Years of Education: N/A   Occupational History  . Not on file.   Social History Main Topics  . Smoking status: Former Smoker    Quit date: 12/17/2008  . Smokeless tobacco: Not on file  . 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 Drug Use: No  . Sexual Activity:    Other Topics Concern  . Not on file   Social History Narrative  . No narrative on file      Medication List       This list is accurate as of: 10/11/14 12:02 PM.  Always use your most recent med list.               calcium-vitamin D 500-200 MG-UNIT per tablet  Commonly known as:  OSCAL WITH D  Take 1 tablet by mouth 2 (two) times daily.     COMBIGAN 0.2-0.5 % ophthalmic solution  Generic drug:  brimonidine-timolol  Place 1 drop into both eyes every 12 (twelve) hours.     donepezil 10 MG  tablet  Commonly known as:  ARICEPT  Take 10 mg by mouth at bedtime.     HYDROcodone-acetaminophen 5-325 MG per tablet  Commonly known as:  NORCO/VICODIN  Take one tablet by mouth twice daily for pain     IRON SUPPLEMENT PO  Take 1 capsule by mouth daily.     lactulose 10 GM/15ML solution  Commonly known as:  CHRONULAC  Take 20 g by mouth daily.     NAMENDA XR 28 MG Cp24  Generic drug:  Memantine HCl ER  Take 1 capsule by mouth daily. For dementia     polyethylene glycol packet  Commonly known as:  MIRALAX / GLYCOLAX  Take 17 g by mouth daily.     pregabalin 50 MG capsule  Commonly known as:  LYRICA  Take one tablet twice a day for neuropathy     senna 8.6 MG tablet  Commonly known as:  SENOKOT  Take 1 tablet by mouth 2 (two) times daily.      TRAVATAN 0.004 % ophthalmic solution  Generic drug:  travoprost (benzalkonium)  1 drop at bedtime.     vitamin C 500 MG tablet  Commonly known as:  ASCORBIC ACID  Take 500 mg by mouth 2 (two) times daily.     VITAMIN D PO  Take 2,000 Units by mouth daily.     zinc sulfate 220 MG capsule  Take 220 mg by mouth 2 (two) times daily.        Physical Exam Filed Vitals:   10/11/14 1155  BP: 114/63  Pulse: 69  Temp: 97.6 F (36.4 C)  Resp: 18   Constitutional: WDWN elderly male in no acute distress.  HEENT: Normocephalic and atraumatic. PERRL. EOM intact. No icterus. Oral mucosa moist. Posterior pharynx clear of any exudate or lesions.  Neck: Supple and nontender. No lymphadenopathy, masses, or thyromegaly. No JVD or carotid bruits. Cardiac: Normal S1, S2. RRR without appreciable murmurs, rubs, or gallops. Distal pulses intact. No dependent edema.  Lungs: No respiratory distress. Breath sounds clear bilaterally without rales, rhonchi, or wheezes. Abdomen: Audible bowel sounds in all quadrants. Soft, nontender, nondistended. No palpable mass.  Musculoskeletal: No joint erythema or tenderness. R hand partially contracted. Able to move all four extremities.  Skin: Warm and dry. No rash noted. No erythema.  Neurological: Alert Psychiatric: Normal mood and affect.   Labs Reviewed CBC Latest Ref Rng 05/19/2014 08/23/2011 03/24/2011  WBC - 5.8 - 12.3(H)  Hemoglobin 13.5 - 17.5 g/dL 10.9(A) 12.6(L) 11.8(L)  Hematocrit 41 - 53 % 35(A) 37.0(L) 35.8(L)  Platelets 150 - 399 K/L 121(A) - 132(L)    CMP     Component Value Date/Time   NA 138 05/13/2014   NA 141 08/23/2011 0756   K 4.2 05/13/2014   CL 106 08/23/2011 0756   CO2 26 03/24/2011 1407   GLUCOSE 80 08/23/2011 0756   BUN 27* 05/13/2014   BUN 30* 08/23/2011 0756   CREATININE 1.20 08/23/2011 0756   CALCIUM 8.8 03/24/2011 1407   PROT 6.8 04/29/2009 1844   ALBUMIN 3.9 04/29/2009 1844   AST 14 05/13/2014   ALT 10 05/13/2014   ALKPHOS 45 04/29/2009  1844   BILITOT 0.6 04/29/2009 1844   GFRNONAA 56* 03/24/2011 1407   GFRAA  Value: >60        The eGFR has been calculated using the MDRD equation. This calculation has not been validated in all clinical situations. eGFR's persistently <60 mL/min signify possible Chronic Kidney Disease. 03/24/2011 1407  Assessment & Plan 1. Pressure ulcer, stage 3 Healing. Continue skin care. Continue zinc sulfate 258m daily, vitamin C 5053mtwice daily, and prostat 3024mwice daily. Continue pressure ulcer precautions and monitor  2. Alzheimer's dementia without behavioral disturbance Persists with decline anticipated. Continue namenda xr 43m54mily and aricept 10mg74mly. Continue to monitor for change in behaviors. Continue fall and pressure ulcer precautions.   3. Neuropathic pain, leg, bilateral Stable. Continue lyrica 50mg 56me daily and norco/apap 5/325mg t75m daily. Continue to monitor  4. Slow transit constipation Persists. Encourage increasing fluid intake. Continue bowel regimen with miralax 17g daily, sennakot twice daily, and lactulose 20g daily. Continue to monitor.    Family/Staff Communication Plan of care discuss with professional staff members. Professional staff members verbalize understanding and agree with plan of care. No additional questions or concerns reported.    Kim NguArthur HolmsAGNP-C [place] ([phone]] After hours: [phone] personally reviewed this note and agree with the care plan  Gazelle Towe [facility] LP[facility]mon[facility] Hospital[facility] [phone]-Friday 8 am - 5 pm) [phone])

## 2014-10-26 ENCOUNTER — Other Ambulatory Visit: Payer: Self-pay | Admitting: *Deleted

## 2014-10-26 MED ORDER — HYDROCODONE-ACETAMINOPHEN 5-325 MG PO TABS: ORAL_TABLET | ORAL | Status: DC

## 2014-10-26 NOTE — Telephone Encounter (Signed)
[facility] 

## 2014-11-11 ENCOUNTER — Non-Acute Institutional Stay (SKILLED_NURSING_FACILITY): Payer: PRIVATE HEALTH INSURANCE | Admitting: Internal Medicine

## 2014-11-11 DIAGNOSIS — E440 Moderate protein-calorie malnutrition: Secondary | ICD-10-CM

## 2014-11-11 DIAGNOSIS — F0390 Unspecified dementia without behavioral disturbance: Secondary | ICD-10-CM

## 2014-11-11 DIAGNOSIS — F3342 Major depressive disorder, recurrent, in full remission: Secondary | ICD-10-CM

## 2014-11-11 DIAGNOSIS — W19XXXD Unspecified fall, subsequent encounter: Secondary | ICD-10-CM

## 2014-11-11 DIAGNOSIS — G9009 Other idiopathic peripheral autonomic neuropathy: Secondary | ICD-10-CM

## 2014-11-11 NOTE — Progress Notes (Signed)
Patient ID: [name], male   DOB: 09-Oct-1926, 80 y.o.   MRN: [id number]    No Known Allergies  HPI 80 y/o male patient is seen today for routine visit. He denies any concerns. He has baseline confusion from dementia. He had a fall on 11/02/14, reviewed xray which is negative for fracture in femur and hip xrays but xray of pelvis showed mild deformity in left inferior pubic ramus at junction of ischium consistent with old fracture. No further falls. Weight remains stable.   Review of Systems   Constitutional: Negative for fever and chills.   Respiratory: Negative for cough and shortness of breath.    Cardiovascular: Negative for chest pain and palpitations.   Gastrointestinal: Negative for heartburn, nausea, vomiting and abdominal pain.   Genitourinary: Negative for dysuria.  Urinary incontinence   Skin: Negative for rash.   Neurological: Negative for dizziness and headaches.   Psychiatric/Behavioral: positive for dementia/ memory loss  Past Medical History  Diagnosis Date  . Vitamin D deficiency   . Chronic lung disease   . Thrombocytopenia   . Anemia   . Dementia   . Glaucoma     chronic angle closure  . Cataract   . Osteoporosis   . Weight loss   . Ulcer     Pressure Ulcers on various body areas  . Alzheimer's disease   . Unspecified constipation   . Incomplete bladder emptying   . Injury to tibial vessel(s), unspecified   . Unspecified vitamin B deficiency   . Pressure ulcer, unspecified site(707.00)   . Depressive disorder, not elsewhere classified   . Unspecified cataract   . Malignant neoplasm of prostate   . Impotence of organic origin   . Alzheimer's disease   . Chronic kidney disease, unspecified    Medication reviewed. See [facility]  Physical exam BP 130/67 mmHg  Pulse 76  Temp(Src) 97.8 F (36.6 C)  Resp 18  SpO2 96%  Constitutional: Elderly , thin, in NAD   HENT:   Head: Normocephalic and atraumatic.   Mouth/Throat: Oropharynx is clear and  moist. Poor upper dentures, missing lower ones.  Eyes: Conjunctivae are normal. Pupils are equal, round, and reactive to light.   Neck: Normal range of motion. Neck supple.  Musculoskeletal: able to move both his LE. He exhibits no edema. Amputated 5th right finger at PIP joint. Self propels on WC Neurological: He is alert.   Skin: Skin is warm and dry. 5th toe pressure ulcer, stage 3 Psychiatric: baseline confusion, calm this visit  Labs reviewed: 05/19/14 wbc 5.8, hb 10.9, hct 35.2, plt 121 10/04/14 na 142, k 4.7, bun 24, cr 1.4, glu 56, ca 9.5, lft wnl 10/18/14 b12 578, t.chol 209, tg 114, ldl 138, hdl 48, tsh 1.23  Assessment/Plan  Depression Off all medications. Currently stable  Fall Recent fall, high fall risk with dementia. Fall precautions. Reviewed xray and fractures ruled out  Senile dementia Continue namenda 10 mg bid with aricept 10 mg daily for now.   Protein calorie malnutrition Stable weight at present, continue supplements  Neuropathy On lyrica 50 mg bid, stable, b12 level reviewed

## 2014-11-30 ENCOUNTER — Other Ambulatory Visit: Payer: Self-pay | Admitting: *Deleted

## 2014-11-30 MED ORDER — HYDROCODONE-ACETAMINOPHEN 5-325 MG PO TABS: ORAL_TABLET | ORAL | Status: DC

## 2014-11-30 NOTE — Telephone Encounter (Signed)
[facility] 

## 2014-12-16 ENCOUNTER — Non-Acute Institutional Stay (SKILLED_NURSING_FACILITY): Payer: PRIVATE HEALTH INSURANCE | Admitting: Internal Medicine

## 2014-12-16 DIAGNOSIS — E440 Moderate protein-calorie malnutrition: Secondary | ICD-10-CM

## 2014-12-16 DIAGNOSIS — D696 Thrombocytopenia, unspecified: Secondary | ICD-10-CM

## 2014-12-16 DIAGNOSIS — D509 Iron deficiency anemia, unspecified: Secondary | ICD-10-CM

## 2014-12-22 DIAGNOSIS — G9009 Other idiopathic peripheral autonomic neuropathy: Secondary | ICD-10-CM | POA: Insufficient documentation

## 2014-12-22 DIAGNOSIS — F3342 Major depressive disorder, recurrent, in full remission: Secondary | ICD-10-CM | POA: Insufficient documentation

## 2014-12-22 DIAGNOSIS — F0390 Unspecified dementia without behavioral disturbance: Secondary | ICD-10-CM | POA: Insufficient documentation

## 2014-12-22 DIAGNOSIS — W19XXXA Unspecified fall, initial encounter: Secondary | ICD-10-CM | POA: Insufficient documentation

## 2014-12-28 ENCOUNTER — Other Ambulatory Visit: Payer: Self-pay | Admitting: *Deleted

## 2014-12-28 MED ORDER — HYDROCODONE-ACETAMINOPHEN 5-325 MG PO TABS: ORAL_TABLET | ORAL | Status: DC

## 2014-12-28 NOTE — Telephone Encounter (Signed)
[facility] 

## 2015-01-14 ENCOUNTER — Non-Acute Institutional Stay (SKILLED_NURSING_FACILITY): Payer: Medicare Other | Admitting: Internal Medicine

## 2015-01-14 DIAGNOSIS — M810 Age-related osteoporosis without current pathological fracture: Secondary | ICD-10-CM

## 2015-01-14 DIAGNOSIS — I739 Peripheral vascular disease, unspecified: Secondary | ICD-10-CM

## 2015-01-14 DIAGNOSIS — F329 Major depressive disorder, single episode, unspecified: Secondary | ICD-10-CM

## 2015-01-14 DIAGNOSIS — N183 Chronic kidney disease, stage 3 (moderate): Secondary | ICD-10-CM

## 2015-01-14 DIAGNOSIS — E538 Deficiency of other specified B group vitamins: Secondary | ICD-10-CM

## 2015-01-14 DIAGNOSIS — H4010X0 Unspecified open-angle glaucoma, stage unspecified: Secondary | ICD-10-CM

## 2015-01-14 DIAGNOSIS — F32A Depression, unspecified: Secondary | ICD-10-CM

## 2015-01-14 NOTE — Progress Notes (Signed)
Patient ID: [name], male   DOB: Apr 09, 1926, 81 y.o.   MRN: [id number]    Facility: [facility]  Cc- medical management of chronic issues   No Known Allergies  Code status: full code  HPI 81 y/o male patient is seen today for routine visit. He denies any concerns. He had a fall during transfer from wheelchair to bed without assistance. No injury reported. He has confusion at baseline, out of bed daily and self propels on wheelchair.  Weight remains stable.   Review of Systems   Constitutional: Negative for fever and chills.   Respiratory: Negative for cough and shortness of breath.    Cardiovascular: Negative for chest pain and palpitations.   Gastrointestinal: Negative for heartburn, nausea, vomiting and abdominal pain.   Genitourinary: Negative for dysuria.  Urinary incontinence   Skin: Negative for rash.   Neurological: Negative for dizziness and headaches.   Psychiatric/Behavioral: positive for dementia/ memory loss  Past Medical History  Diagnosis Date  . Vitamin D deficiency   . Chronic lung disease   . Thrombocytopenia   . Anemia   . Dementia   . Glaucoma     chronic angle closure  . Cataract   . Osteoporosis   . Weight loss   . Ulcer     Pressure Ulcers on various body areas  . Alzheimer's disease   . Unspecified constipation   . Incomplete bladder emptying   . Injury to tibial vessel(s), unspecified   . Unspecified vitamin B deficiency   . Pressure ulcer, unspecified site(707.00)   . Depressive disorder, not elsewhere classified   . Unspecified cataract   . Malignant neoplasm of prostate   . Impotence of organic origin   . Alzheimer's disease   . Chronic kidney disease, unspecified    Medication reviewed. See [facility]  Physical exam BP 124/74 mmHg  Pulse 59  Temp(Src) 96.4 F (35.8 C)  Resp 18  SpO2 97%  Constitutional: Elderly , thin, in NAD   HENT:   Head: Normocephalic and atraumatic.     Mouth/Throat: Oropharynx is clear and moist. Poor upper dentures, missing lower ones.   Eyes: Conjunctivae are normal. Pupils are equal, round, and reactive to light.   Neck: Normal range of motion. Neck supple.  Musculoskeletal: able to move both his LE. He exhibits no edema. Amputated 5th right finger at PIP joint. Self propels on WC Neurological: He is alert.   Skin: Skin is warm and dry. 5th toe pressure ulcer, stage 3 Psychiatric: baseline confusion, calm this visit  Labs reviewed: 05/19/14 wbc 5.8, hb 10.9, hct 35.2, plt 121 10/04/14 na 142, k 4.7, bun 24, cr 1.4, glu 56, ca 9.5, lft wnl 10/18/14 b12 578, t.chol 209, tg 114, ldl 138, hdl 48, tsh 1.23  Assessment/Plan  PVD Chronic, stable, no new wounds, continue pressure ulcer prophylaxis and monitor for wounds  Vitamin b12 deficiency Stable, off meds, f/u b12 level  ckd stage 3 Monitor clinically  Age related osteoporosis Continue calcium and vitamin d with fall precautions  Depression Off all medications. Currently stable  Open angle glaucoma Continue travatan and combigan for now, monitor

## 2015-01-14 NOTE — Progress Notes (Signed)
Patient ID: [name], male   DOB: 06/21/1926, 81 y.o.   MRN: [id number]    Facility: [facility]  Cc- medical management of chronic issues   No Known Allergies  Code status: full code  HPI 81 y/o male patient is seen today for routine visit. He denies any concerns. He has confusion at baseline, out of bed daily and self propels on wheelchair.  Has lost some weight   Review of Systems   Constitutional: Negative for fever and chills.   Respiratory: Negative for cough and shortness of breath.    Cardiovascular: Negative for chest pain and palpitations.   Gastrointestinal: Negative for heartburn, nausea, vomiting and abdominal pain.   Genitourinary: Urinary incontinence   Skin: Negative for rash.   Neurological: Negative for dizziness and headaches.   Psychiatric/Behavioral: positive for dementia/ memory loss  Past medical history and medication reviewed  Physical exam BP 123/65 mmHg  Pulse 68  Temp(Src) 98.2 F (36.8 C)  Resp 16  SpO2 96%  Constitutional: Elderly , thin, in NAD, baseline confusion HENT:   Head: Normocephalic and atraumatic.   Mouth/Throat: Oropharynx is clear and moist. Poor upper dentures, missing lower ones.   Eyes: Conjunctivae are normal. Pupils are equal, round, and reactive to light.   Neck: Normal range of motion. Neck supple.  Musculoskeletal: able to move both his LE. He exhibits no edema. Amputated 5th right finger at PIP joint. Self propels on WC Neurological: He is alert.    Labs reviewed: 05/19/14 wbc 5.8, hb 10.9, hct 35.2, plt 121 10/04/14 na 142, k 4.7, bun 24, cr 1.4, glu 56, ca 9.5, lft wnl 10/18/14 b12 578, t.chol 209, tg 114, ldl 138, hdl 48, tsh 1.23  Assessment/Plan  Iron deficiency anemia Continue ferrex capsule, monitor h&h  thrombocytopenia Off all antiplatelet agent, no bleeding or skin concerns, monitor clinically  Protein calorie malnutrition Lost 10 lbs in 90 days, decline  anticipated with his dementia. Continue medpass, monitor weight, continue skin monitoring with pressure ulcer prophylaxis

## 2015-01-27 ENCOUNTER — Other Ambulatory Visit: Payer: Self-pay | Admitting: *Deleted

## 2015-01-27 MED ORDER — HYDROCODONE-ACETAMINOPHEN 5-325 MG PO TABS: ORAL_TABLET | ORAL | Status: DC

## 2015-01-27 NOTE — Telephone Encounter (Signed)
[facility] 

## 2015-02-22 ENCOUNTER — Other Ambulatory Visit: Payer: Self-pay | Admitting: *Deleted

## 2015-02-22 MED ORDER — HYDROCODONE-ACETAMINOPHEN 5-325 MG PO TABS: ORAL_TABLET | ORAL | Status: DC

## 2015-02-22 MED ORDER — PREGABALIN 50 MG PO CAPS: ORAL_CAPSULE | ORAL | Status: DC

## 2015-02-22 NOTE — Telephone Encounter (Signed)
[facility] # [phone] Fax: [phone] 

## 2015-02-22 NOTE — Telephone Encounter (Signed)
[facility] 

## 2015-03-28 ENCOUNTER — Other Ambulatory Visit: Payer: Self-pay | Admitting: *Deleted

## 2015-03-28 MED ORDER — HYDROCODONE-ACETAMINOPHEN 5-325 MG PO TABS: ORAL_TABLET | ORAL | Status: DC

## 2015-03-28 NOTE — Telephone Encounter (Signed)
[facility] 

## 2015-04-06 ENCOUNTER — Inpatient Hospital Stay (HOSPITAL_COMMUNITY)
Admission: EM | Admit: 2015-04-06 | Discharge: 2015-04-12 | DRG: 682 | Disposition: A | Discharge status: Hospice | Payer: Medicare Other | Attending: Internal Medicine | Admitting: Internal Medicine

## 2015-04-06 ENCOUNTER — Inpatient Hospital Stay (HOSPITAL_COMMUNITY): Payer: Medicare Other

## 2015-04-06 ENCOUNTER — Emergency Department (HOSPITAL_COMMUNITY): Payer: Medicare Other

## 2015-04-06 ENCOUNTER — Encounter (HOSPITAL_COMMUNITY): Payer: Self-pay | Admitting: Emergency Medicine

## 2015-04-06 DIAGNOSIS — F0281 Dementia in other diseases classified elsewhere with behavioral disturbance: Secondary | ICD-10-CM | POA: Diagnosis present

## 2015-04-06 DIAGNOSIS — D509 Iron deficiency anemia, unspecified: Secondary | ICD-10-CM | POA: Diagnosis present

## 2015-04-06 DIAGNOSIS — G309 Alzheimer's disease, unspecified: Secondary | ICD-10-CM | POA: Diagnosis present

## 2015-04-06 DIAGNOSIS — Y658 Other specified misadventures during surgical and medical care: Secondary | ICD-10-CM | POA: Diagnosis present

## 2015-04-06 DIAGNOSIS — F329 Major depressive disorder, single episode, unspecified: Secondary | ICD-10-CM | POA: Diagnosis present

## 2015-04-06 DIAGNOSIS — N19 Unspecified kidney failure: Secondary | ICD-10-CM

## 2015-04-06 DIAGNOSIS — G9340 Encephalopathy, unspecified: Secondary | ICD-10-CM | POA: Diagnosis present

## 2015-04-06 DIAGNOSIS — Y92129 Unspecified place in nursing home as the place of occurrence of the external cause: Secondary | ICD-10-CM

## 2015-04-06 DIAGNOSIS — E559 Vitamin D deficiency, unspecified: Secondary | ICD-10-CM | POA: Diagnosis present

## 2015-04-06 DIAGNOSIS — R0902 Hypoxemia: Secondary | ICD-10-CM

## 2015-04-06 DIAGNOSIS — Z66 Do not resuscitate: Secondary | ICD-10-CM | POA: Diagnosis present

## 2015-04-06 DIAGNOSIS — Z87891 Personal history of nicotine dependence: Secondary | ICD-10-CM

## 2015-04-06 DIAGNOSIS — M810 Age-related osteoporosis without current pathological fracture: Secondary | ICD-10-CM | POA: Diagnosis present

## 2015-04-06 DIAGNOSIS — E43 Unspecified severe protein-calorie malnutrition: Secondary | ICD-10-CM | POA: Diagnosis present

## 2015-04-06 DIAGNOSIS — N179 Acute kidney failure, unspecified: Secondary | ICD-10-CM | POA: Diagnosis present

## 2015-04-06 DIAGNOSIS — Z789 Other specified health status: Secondary | ICD-10-CM

## 2015-04-06 DIAGNOSIS — E860 Dehydration: Secondary | ICD-10-CM | POA: Diagnosis present

## 2015-04-06 DIAGNOSIS — R319 Hematuria, unspecified: Secondary | ICD-10-CM | POA: Diagnosis present

## 2015-04-06 DIAGNOSIS — E875 Hyperkalemia: Secondary | ICD-10-CM | POA: Diagnosis present

## 2015-04-06 DIAGNOSIS — Z515 Encounter for palliative care: Secondary | ICD-10-CM

## 2015-04-06 DIAGNOSIS — R7989 Other specified abnormal findings of blood chemistry: Secondary | ICD-10-CM | POA: Diagnosis not present

## 2015-04-06 DIAGNOSIS — Z8546 Personal history of malignant neoplasm of prostate: Secondary | ICD-10-CM | POA: Diagnosis not present

## 2015-04-06 DIAGNOSIS — E870 Hyperosmolality and hypernatremia: Secondary | ICD-10-CM | POA: Diagnosis present

## 2015-04-06 DIAGNOSIS — T8389XA Other specified complication of genitourinary prosthetic devices, implants and grafts, initial encounter: Secondary | ICD-10-CM | POA: Diagnosis present

## 2015-04-06 DIAGNOSIS — N390 Urinary tract infection, site not specified: Secondary | ICD-10-CM | POA: Diagnosis present

## 2015-04-06 DIAGNOSIS — E539 Vitamin B deficiency, unspecified: Secondary | ICD-10-CM | POA: Diagnosis present

## 2015-04-06 DIAGNOSIS — B9620 Unspecified Escherichia coli [E. coli] as the cause of diseases classified elsewhere: Secondary | ICD-10-CM | POA: Diagnosis present

## 2015-04-06 DIAGNOSIS — R9389 Abnormal findings on diagnostic imaging of other specified body structures: Secondary | ICD-10-CM

## 2015-04-06 DIAGNOSIS — R748 Abnormal levels of other serum enzymes: Secondary | ICD-10-CM | POA: Diagnosis present

## 2015-04-06 DIAGNOSIS — F0280 Dementia in other diseases classified elsewhere without behavioral disturbance: Secondary | ICD-10-CM | POA: Diagnosis present

## 2015-04-06 DIAGNOSIS — R680 Hypothermia, not associated with low environmental temperature: Secondary | ICD-10-CM | POA: Diagnosis not present

## 2015-04-06 DIAGNOSIS — R778 Other specified abnormalities of plasma proteins: Secondary | ICD-10-CM | POA: Diagnosis present

## 2015-04-06 HISTORY — DX: Peripheral vascular disease, unspecified: I73.9

## 2015-04-06 LAB — COMPREHENSIVE METABOLIC PANEL
ALT: 44 U/L (ref 0–53)
ANION GAP: 19 — AB (ref 5–15)
AST: 51 U/L — AB (ref 0–37)
Albumin: 4.3 g/dL (ref 3.5–5.2)
Alkaline Phosphatase: 75 U/L (ref 39–117)
BILIRUBIN TOTAL: 0.9 mg/dL (ref 0.3–1.2)
BUN: 102 mg/dL — ABNORMAL HIGH (ref 6–23)
CHLORIDE: 123 mmol/L — AB (ref 96–112)
CO2: 24 mmol/L (ref 19–32)
CREATININE: 4.33 mg/dL — AB (ref 0.50–1.35)
Calcium: 11.9 mg/dL — ABNORMAL HIGH (ref 8.4–10.5)
GFR calc Af Amer: 13 mL/min — ABNORMAL LOW (ref >90)
GFR calc non Af Amer: 11 mL/min — ABNORMAL LOW (ref >90)
Glucose, Bld: 114 mg/dL — ABNORMAL HIGH (ref 70–99)
POTASSIUM: 4.6 mmol/L (ref 3.5–5.1)
Sodium: 166 mmol/L (ref 135–145)
Total Protein: 8.5 g/dL — ABNORMAL HIGH (ref 6.0–8.3)

## 2015-04-06 LAB — PROTIME-INR
INR: 1.31 (ref 0.00–1.49)
Prothrombin Time: 16.4 seconds — ABNORMAL HIGH (ref 11.6–15.2)

## 2015-04-06 LAB — CBC WITH DIFFERENTIAL/PLATELET
BASOS ABS: 0 10*3/uL (ref 0.0–0.1)
Basophils Relative: 0 % (ref 0–1)
Eosinophils Absolute: 0 10*3/uL (ref 0.0–0.7)
Eosinophils Relative: 1 % (ref 0–5)
HEMATOCRIT: 54.2 % — AB (ref 39.0–52.0)
Hemoglobin: 17.2 g/dL — ABNORMAL HIGH (ref 13.0–17.0)
Lymphocytes Relative: 37 % (ref 12–46)
Lymphs Abs: 3.2 10*3/uL (ref 0.7–4.0)
MCH: 32 pg (ref 26.0–34.0)
MCHC: 31.7 g/dL (ref 30.0–36.0)
MCV: 100.7 fL — ABNORMAL HIGH (ref 78.0–100.0)
MONO ABS: 0.6 10*3/uL (ref 0.1–1.0)
Monocytes Relative: 7 % (ref 3–12)
NEUTROS ABS: 4.8 10*3/uL (ref 1.7–7.7)
Neutrophils Relative %: 55 % (ref 43–77)
Platelets: 90 10*3/uL — ABNORMAL LOW (ref 150–400)
RBC: 5.38 MIL/uL (ref 4.22–5.81)
RDW: 16.1 % — AB (ref 11.5–15.5)
WBC: 8.8 10*3/uL (ref 4.0–10.5)

## 2015-04-06 LAB — URINALYSIS, ROUTINE W REFLEX MICROSCOPIC
GLUCOSE, UA: NEGATIVE mg/dL
Ketones, ur: 15 mg/dL — AB
Nitrite: POSITIVE — AB
SPECIFIC GRAVITY, URINE: 1.024 (ref 1.005–1.030)
UROBILINOGEN UA: 1 mg/dL (ref 0.0–1.0)
pH: 7.5 (ref 5.0–8.0)

## 2015-04-06 LAB — URINE MICROSCOPIC-ADD ON

## 2015-04-06 LAB — I-STAT CHEM 8, ED
BUN: 88 mg/dL — ABNORMAL HIGH (ref 6–23)
CHLORIDE: 125 mmol/L — AB (ref 96–112)
Calcium, Ion: 1.25 mmol/L (ref 1.13–1.30)
Creatinine, Ser: 4 mg/dL — ABNORMAL HIGH (ref 0.50–1.35)
Glucose, Bld: 112 mg/dL — ABNORMAL HIGH (ref 70–99)
HCT: 54 % — ABNORMAL HIGH (ref 39.0–52.0)
Hemoglobin: 18.4 g/dL — ABNORMAL HIGH (ref 13.0–17.0)
POTASSIUM: 4.2 mmol/L (ref 3.5–5.1)
Sodium: 166 mmol/L (ref 135–145)
TCO2: 21 mmol/L (ref 0–100)

## 2015-04-06 LAB — I-STAT TROPONIN, ED: Troponin i, poc: 0.13 ng/mL (ref 0.00–0.08)

## 2015-04-06 LAB — LACTIC ACID, PLASMA: LACTIC ACID, VENOUS: 3 mmol/L — AB (ref 0.5–2.0)

## 2015-04-06 MED ORDER — SODIUM CHLORIDE 0.9 % IV SOLN
INTRAVENOUS | Status: DC
  Administered 2015-04-06: 23:00:00 via INTRAVENOUS

## 2015-04-06 MED ORDER — SODIUM CHLORIDE 0.9 % IV SOLN
Freq: Once | INTRAVENOUS | Status: AC
  Administered 2015-04-06: 17:00:00 via INTRAVENOUS

## 2015-04-06 MED ORDER — DEXTROSE 5 % IV SOLN
1.0000 g | Freq: Once | INTRAVENOUS | Status: AC
  Administered 2015-04-06: 1 g via INTRAVENOUS
  Filled 2015-04-06: qty 10

## 2015-04-06 MED ORDER — SODIUM CHLORIDE 0.9 % IV BOLUS (SEPSIS)
1000.0000 mL | Freq: Once | INTRAVENOUS | Status: AC
  Administered 2015-04-06: 1000 mL via INTRAVENOUS

## 2015-04-06 MED ORDER — DEXTROSE 5 % IV SOLN: Freq: Once | INTRAVENOUS | Status: DC

## 2015-04-06 MED ORDER — DEXTROSE 5 % IV SOLN
INTRAVENOUS | Status: DC
  Administered 2015-04-06: 1000 mL via INTRAVENOUS

## 2015-04-06 MED ORDER — CEFTRIAXONE SODIUM IN DEXTROSE 20 MG/ML IV SOLN
1.0000 g | INTRAVENOUS | Status: DC
  Administered 2015-04-07: 1 g via INTRAVENOUS
  Filled 2015-04-06 (×2): qty 50

## 2015-04-06 MED ORDER — SODIUM CHLORIDE 0.9 % IJ SOLN
3.0000 mL | Freq: Two times a day (BID) | INTRAMUSCULAR | Status: DC
  Administered 2015-04-07 – 2015-04-08 (×2): 3 mL via INTRAVENOUS

## 2015-04-06 NOTE — ED Notes (Signed)
When attempting I&O catherization, bright red blood came through catheter, catheter was removed at sight of blood

## 2015-04-06 NOTE — ED Notes (Signed)
Patient transported to CT 

## 2015-04-06 NOTE — H&P (Signed)
PCP:   PATEL,BHAIRAVI, MD   Chief Complaint:  Abnormal labs  HPI: 81 yo male adv dementia sent in from snf with worsening dehydration, na level over 160, cr up to 4 from prev nml..  No report of fevers.  No n/v/d.  Patient cannot provide any history due to chronic dementia.  A foley was attempted to be placed at SNF which was traumatic and his penis started bleeding.  He was actually sent here because his bleeding would not stop.  It has since stopped in the ED.  Pt appears to be at his baseline mental status despite the markedly abnormal lab values.  He complains of no pain.  Appears comfortable.  Knows his name.  Foley has been placed in ED and position verified with ct scan as it was difficult for dr rancour to verify with bedside ultrasound.    Review of Systems:  Unobtainable  Past Medical History: Past Medical History  Diagnosis Date  . Vitamin D deficiency   . Chronic lung disease   . Thrombocytopenia   . Anemia   . Dementia   . Glaucoma     chronic angle closure  . Cataract   . Osteoporosis   . Weight loss   . Ulcer     Pressure Ulcers on various body areas  . Alzheimer's disease   . Unspecified constipation   . Incomplete bladder emptying   . Injury to tibial vessel(s), unspecified   . Unspecified vitamin B deficiency   . Pressure ulcer, unspecified site(707.00)   . Depressive disorder, not elsewhere classified   . Unspecified cataract   . Malignant neoplasm of prostate   . Impotence of organic origin   . Alzheimer's disease   . Chronic kidney disease, unspecified    Past Surgical History  Procedure Laterality Date  . Eye surgery  cataract surgery bilateral   2012  . Aortogram  08-23-2011    Medications: Prior to Admission medications   Medication Sig Start Date End Date Taking? Authorizing Provider  brimonidine-timolol (COMBIGAN) 0.2-0.5 % ophthalmic solution Place 1 drop into both eyes every 12 (twelve) hours.   Yes Historical Provider, MD   calcium-vitamin D (OSCAL WITH D) 500-200 MG-UNIT per tablet Take 1 tablet by mouth 2 (two) times daily.     Yes Historical Provider, MD  Cholecalciferol (VITAMIN D PO) Take 2,000 Units by mouth daily.    Yes Historical Provider, MD  donepezil (ARICEPT) 10 MG tablet Take 10 mg by mouth at bedtime.     Yes Historical Provider, MD  Ferrous Sulfate (IRON SUPPLEMENT PO) Take 1 capsule by mouth daily.    Yes Historical Provider, MD  HYDROcodone-acetaminophen (NORCO/VICODIN) 5-325 MG per tablet Take one tablet by mouth twice daily for pain. Do not exceed 4gm of Tylenol in 24 hours 03/28/15  Yes Tiffany L Reed, DO  lactulose (CHRONULAC) 10 GM/15ML solution Take 20 g by mouth daily.   Yes Historical Provider, MD  Memantine HCl ER (NAMENDA XR) 28 MG CP24 Take 1 capsule by mouth daily. For dementia   Yes Historical Provider, MD  polyethylene glycol (MIRALAX / GLYCOLAX) packet Take 17 g by mouth daily.   Yes Historical Provider, MD  pregabalin (LYRICA) 50 MG capsule Take one tablet twice a day for neuropathy Patient taking differently: Take 50 mg by mouth 2 (two) times daily. Take one tablet twice a day for neuropathy 02/22/15  Yes Mahima Pandey, MD  senna (SENOKOT) 8.6 MG tablet Take 1 tablet by mouth 2 (two)  times daily.     Yes Historical Provider, MD  travoprost, benzalkonium, (TRAVATAN) 0.004 % ophthalmic solution 1 drop at bedtime.     Yes Historical Provider, MD  vitamin C (ASCORBIC ACID) 500 MG tablet Take 500 mg by mouth 2 (two) times daily.   Yes Historical Provider, MD    Allergies:  No Known Allergies  Social History:  reports that he quit smoking about 6 years ago. He does not have any smokeless tobacco history on file. He reports that he drinks alcohol. He reports that he does not use illicit drugs.  Family History: Family History  Problem Relation Age of Onset  . Heart disease Sister     Physical Exam: Filed Vitals:   04/06/15 1730 04/06/15 1734 04/06/15 1830 04/06/15 1845  BP: 104/65  104/65 112/88 129/69  Pulse:  80    Resp: 14 19 19 16   SpO2:  100%     General appearance: alert, cooperative and no distress Head: Normocephalic, without obvious abnormality, atraumatic Eyes: negative Nose: Nares normal. Septum midline. Mucosa normal. No drainage or sinus tenderness. Neck: no JVD and supple, symmetrical, trachea midline Lungs: clear to auscultation bilaterally Heart: regular rate and rhythm, S1, S2 normal, no murmur, click, rub or gallop Abdomen: soft, non-tender; bowel sounds normal; no masses,  no organomegaly  Foley with gross hematuria Extremities: extremities normal, atraumatic, no cyanosis or edema Pulses: 2+ and symmetric Skin: Skin color, texture, turgor normal. No rashes or lesions Neurologic: Grossly normal    Labs on Admission:   Recent Labs  04/06/15 1709 04/06/15 1737  NA 166* 166*  K 4.6 4.2  CL 123* 125*  CO2 24  --   GLUCOSE 114* 112*  BUN 102* 88*  CREATININE 4.33* 4.00*  CALCIUM 11.9*  --     Recent Labs  04/06/15 1709  AST 51*  ALT 44  ALKPHOS 75  BILITOT 0.9  PROT 8.5*  ALBUMIN 4.3    Recent Labs  04/06/15 1709 04/06/15 1737  WBC 8.8  --   NEUTROABS 4.8  --   HGB 17.2* 18.4*  HCT 54.2* 54.0*  MCV 100.7*  --   PLT 90*  --    Radiological Exams on Admission: Dg Chest 1 View  04/06/2015   CLINICAL DATA:  Acute renal failure  EXAM: CHEST  1 VIEW  COMPARISON:  03/24/2011  FINDINGS: The heart size and mediastinal contours are within normal limits. Both lungs are clear. The visualized skeletal structures are unremarkable.  IMPRESSION: No active disease.   Electronically Signed   By: Inez Catalina M.D.   On: 04/06/2015 16:39    Assessment/Plan  81 yo male adv dementia with hypernatremia and acute kidney injury due to profound dehydration  Principal Problem:   Acute renal failure-  From dehydration.  Ivf.  Also has uti, tx with rocephin, uc sent.  Active Problems:   Alzheimer's dementia without behavioral disturbance    Iron deficiency anemia   Hypernatremia-  Give ns for volume repletion first and monitor bmp q 4 hours as not to drop na level too quickly.  Pt is at least 3 liters down.  Is getting first liter bolus now.  Surprisingly mentating pretty well.   Dehydration, severe-  As above   Elevated troponin-  Serial trop, likely due to decline in renal function   UTI (lower urinary tract infection)-  Rocephin,  uc sent.  Full code per SNF paperwork.  Admit to tele bed.  Will likely be here 3-4 days  in order to slowly correct his na levels and improve his renal function.  Taniyah Ballow A 04/06/2015, 7:27 PM

## 2015-04-06 NOTE — ED Notes (Signed)
Phlebotomy at bedside.

## 2015-04-06 NOTE — Progress Notes (Signed)
Patient did not look good on arrival- rapid response called. [name] came up to assess patient. o2 stats were at 100. Patient was able to follow commands. Vitals looked good. Urine still redden in color. Patient had previously had a bolus for dehydration and was now receiving fluids at 100cc/hr. Lactic acid to be repeated. Will continue monitoring.

## 2015-04-06 NOTE — ED Notes (Signed)
Rancour, MD at bedside with [place] d/t no urine return with foley insertion, estimated 10 mL blood in drainage bag

## 2015-04-06 NOTE — Progress Notes (Signed)
ED RN will call for report when patient is done with testing. MD aware.

## 2015-04-06 NOTE — ED Notes (Signed)
Pt arrived from [facility] with c/o acute renal failure. Staff at facility attempted to start foley cath and pt started bleeding so they stopped. Approximately 66ml of blood. Afib on the monitor with unknown his of afib.  Pt unresponsive when EMS arrived and since has been coming back around but unsure what normal baseline is.

## 2015-04-06 NOTE — ED Notes (Signed)
Per Rancour, MD the pt is to remain in the ED until CT head is completed & verification of foley is confirmed, flow manager notified, Darci Current, Charge RN aware

## 2015-04-06 NOTE — ED Notes (Signed)
Attempted to call report to floor; on hold for more than 3 minutes;will call back

## 2015-04-06 NOTE — ED Notes (Signed)
Pts admitting MD at bedside, aware of completion of tests prior to transport to 5W, 5 W RN aware of plan

## 2015-04-06 NOTE — ED Provider Notes (Signed)
CSN: [id number]     Arrival date & time 04/06/15  1542 History   First MD Initiated Contact with Patient 04/06/15 1549     Chief Complaint  Patient presents with  . Acute Renal Failure     (Consider location/radiation/quality/duration/timing/severity/associated sxs/prior Treatment) HPI Comments: 05 caveat for dementia. Sent from rehabilitation facility with acute renal failure and hypernatremia. Patient found to have creatinine 3.5 and sodium 163 on labs drawn today. No reports of fever. Staff apparently tried to insert a Foley catheter but stopped after he developed some penile bleeding which has since resolved. Patient is not on any anticoagulation. He is oriented 2. He is not sure why he is here. He denies any pain.  The history is provided by the patient and the EMS personnel. The history is limited by the condition of the patient.    Past Medical History  Diagnosis Date  . Vitamin D deficiency   . Chronic lung disease   . Thrombocytopenia   . Anemia   . Dementia   . Glaucoma     chronic angle closure  . Cataract   . Osteoporosis   . Weight loss   . Ulcer     Pressure Ulcers on various body areas  . Alzheimer's disease   . Unspecified constipation   . Incomplete bladder emptying   . Injury to tibial vessel(s), unspecified   . Unspecified vitamin B deficiency   . Pressure ulcer, unspecified site(707.00)   . Depressive disorder, not elsewhere classified   . Unspecified cataract   . Malignant neoplasm of prostate   . Impotence of organic origin   . Alzheimer's disease   . Chronic kidney disease, unspecified    Past Surgical History  Procedure Laterality Date  . Eye surgery  cataract surgery bilateral   2012  . Aortogram  08-23-2011   Family History  Problem Relation Age of Onset  . Heart disease Sister    History  Substance Use Topics  . Smoking status: Former Smoker    Quit date: 12/17/2008  . Smokeless tobacco: Not on file  . 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  Home Medications   Prior to Admission medications   Medication Sig Start Date End Date Taking? Authorizing Provider  brimonidine-timolol (COMBIGAN) 0.2-0.5 % ophthalmic solution Place 1 drop into both eyes every 12 (twelve) hours.   Yes Historical Provider, MD  calcium-vitamin D (OSCAL WITH D) 500-200 MG-UNIT per tablet Take 1 tablet by mouth 2 (two) times daily.     Yes Historical Provider, MD  Cholecalciferol (VITAMIN D PO) Take 2,000 Units by mouth daily.    Yes Historical Provider, MD  donepezil (ARICEPT) 10 MG tablet Take 10 mg by mouth at bedtime.     Yes Historical Provider, MD  Ferrous Sulfate (IRON SUPPLEMENT PO) Take 1 capsule by mouth daily.    Yes Historical Provider, MD  HYDROcodone-acetaminophen (NORCO/VICODIN) 5-325 MG per tablet Take one tablet by mouth twice daily for pain. Do not exceed 4gm of Tylenol in 24 hours 03/28/15  Yes Tiffany L Reed, DO  lactulose (CHRONULAC) 10 GM/15ML solution Take 20 g by mouth daily.   Yes Historical Provider, MD  Memantine HCl ER (NAMENDA XR) 28 MG CP24 Take 1 capsule by mouth daily. For dementia   Yes Historical Provider, MD  polyethylene glycol (MIRALAX / GLYCOLAX) packet Take 17 g by mouth daily.   Yes Historical Provider, MD  pregabalin (LYRICA) 50 MG capsule Take one tablet twice a day for neuropathy Patient taking differently: Take 50 mg by mouth 2 (two) times daily. Take one tablet twice a day for neuropathy 02/22/15  Yes Mahima Pandey, MD  senna (SENOKOT) 8.6 MG tablet Take 1 tablet by mouth 2 (two) times daily.     Yes Historical Provider, MD  travoprost, benzalkonium, (TRAVATAN) 0.004 % ophthalmic solution 1 drop at bedtime.     Yes Historical Provider, MD  vitamin C (ASCORBIC ACID) 500 MG tablet Take 500 mg by mouth 2 (two) times daily.   Yes Historical Provider, MD   BP  128/58 mmHg  Pulse 58  Temp(Src)  (Oral)  Resp 16  Wt 126 lb 12.2 oz (57.5 kg)  SpO2 100% Physical Exam  Constitutional: He appears well-developed and well-nourished. No distress.  Disheveled, dry mucous membranes  HENT:  Head: Normocephalic and atraumatic.  Mouth/Throat: Oropharynx is clear and moist. No oropharyngeal exudate.  Eyes: Conjunctivae and EOM are normal. Pupils are equal, round, and reactive to light.  Neck: Normal range of motion. Neck supple.  Cardiovascular: Normal rate and normal heart sounds.   No murmur heard. Pulmonary/Chest: Effort normal and breath sounds normal.  Abdominal: Soft. There is no tenderness. There is no rebound and no guarding.  Genitourinary:  Blood at urethral meatus. No testicular tenderness  Musculoskeletal: Normal range of motion. He exhibits no edema or tenderness.  Neurological: He is alert.  Moving all extremities, follows commands  Skin: Skin is warm.    ED Course  Procedures (including critical care time) Labs Review Labs Reviewed  CBC WITH DIFFERENTIAL/PLATELET - Abnormal; Notable for the following:    Hemoglobin 17.2 (*)    HCT 54.2 (*)    MCV 100.7 (*)    RDW 16.1 (*)    Platelets 90 (*)    All other components within normal limits  COMPREHENSIVE METABOLIC PANEL - Abnormal; Notable for the following:    Sodium 166 (*)    Chloride 123 (*)    Glucose, Bld 114 (*)    BUN 102 (*)    Creatinine, Ser 4.33 (*)    Calcium 11.9 (*)    Total Protein 8.5 (*)    AST 51 (*)    GFR calc non Af Amer 11 (*)    GFR calc Af Amer 13 (*)    Anion gap 19 (*)    All other components within normal limits  URINALYSIS, ROUTINE W REFLEX MICROSCOPIC - Abnormal; Notable for the following:    Color, Urine RED (*)    APPearance TURBID (*)    Hgb urine dipstick LARGE (*)    Bilirubin Urine LARGE (*)    Ketones, ur 15 (*)    Protein, ur >300 (*)    Nitrite POSITIVE (*)    Leukocytes, UA LARGE (*)    All other components within normal limits   URINE MICROSCOPIC-ADD ON - Abnormal; Notable for the following:    Bacteria, UA MANY (*)    All other components within normal limits  LACTIC ACID, PLASMA - Abnormal; Notable for the following:    Lactic Acid, Venous 3.0 (*)    All other components within normal limits  PROTIME-INR - Abnormal; Notable for the following:    Prothrombin Time 16.4 (*)    All other components within normal limits  I-STAT CHEM 8, ED - Abnormal;  Notable for the following:    Sodium 166 (*)    Chloride 125 (*)    BUN 88 (*)    Creatinine, Ser 4.00 (*)    Glucose, Bld 112 (*)    Hemoglobin 18.4 (*)    HCT 54.0 (*)    All other components within normal limits  I-STAT TROPOININ, ED - Abnormal; Notable for the following:    Troponin i, poc 0.13 (*)    All other components within normal limits  URINE CULTURE  MRSA PCR SCREENING  LACTIC ACID, PLASMA  CBC  BASIC METABOLIC PANEL  BASIC METABOLIC PANEL    Imaging Review Ct Abdomen Pelvis Wo Contrast  04/06/2015   CLINICAL DATA:  Acute renal failure and bleeding from penis during Foley catheter placement  EXAM: CT ABDOMEN AND PELVIS WITHOUT CONTRAST  TECHNIQUE: Multidetector CT imaging of the abdomen and pelvis was performed following the standard protocol without IV contrast.  COMPARISON:  None.  FINDINGS: Lung bases demonstrate very minimal atelectasis in the right lower lobe.  The liver, gallbladder, spleen, adrenal glands and pancreas are within normal limits. The kidneys are well visualized bilaterally. Right renal pelvic calculi are noted without obstructive change. The right ureter appears within normal limits. The bladder is decompressed by Foley catheter. Bilateral renal cystic change is noted.  Aortoiliac calcifications are seen without aneurysmal dilatation. The appendix is within normal limits. Diverticular change is noted without diverticulitis.  IMPRESSION: Right renal pelvic calculi without obstructive change.  Bilateral renal cysts.  Diverticulosis  without diverticulitis.  No other focal abnormality is seen.   Electronically Signed   By: Inez Catalina M.D.   On: 04/06/2015 21:04   Dg Chest 1 View  04/06/2015   CLINICAL DATA:  Acute renal failure  EXAM: CHEST  1 VIEW  COMPARISON:  03/24/2011  FINDINGS: The heart size and mediastinal contours are within normal limits. Both lungs are clear. The visualized skeletal structures are unremarkable.  IMPRESSION: No active disease.   Electronically Signed   By: Inez Catalina M.D.   On: 04/06/2015 16:39   Ct Head Wo Contrast  04/06/2015   CLINICAL DATA:  Acute renal failure. Initially unresponsive. Altered mental status.  EXAM: CT HEAD WITHOUT CONTRAST  TECHNIQUE: Contiguous axial images were obtained from the base of the skull through the vertex without intravenous contrast.  COMPARISON:  02/20/2007  FINDINGS: The brain shows generalized atrophy. There chronic small-vessel ischemic changes of the deep white matter. No sign of acute infarction, mass lesion, hemorrhage, hydrocephalus or extra-axial collection. Physiologic calcification of the basal ganglia. Prominent dural calcification. Neither is significant. There is atherosclerotic calcification of the major vessels at the base of the brain.  IMPRESSION: Generalized atrophy. Chronic small vessel disease. No acute or reversible finding.   Electronically Signed   By: Nelson Chimes M.D.   On: 04/06/2015 21:00     EKG Interpretation   Date/Time:  Wednesday April 06 2015 16:52:40 EDT Ventricular Rate:  92 PR Interval:  135 QRS Duration: 122 QT Interval:  379 QTC Calculation: 469 R Axis:   -33 Text Interpretation:  Sinus rhythm Nonspecific intraventricular conduction  delay Nonspecific T abnormalities, lateral leads Nonspecific ST  abnormality Confirmed by Wyvonnia Dusky  MD, Kenyada Dosch [phone]) on 04/06/2015 5:09:50  PM      MDM   Final diagnoses:  Hypernatremia  Acute renal failure, unspecified acute renal failure type   Patient from nursing home with  mental status change, hypernatremia and renal failure.  Labs show sodium 166,  creatinine 4, lactate 3. UA positive for infection. Culture sent.   There was difficulty determining position of Foley catheter as bedside ultrasound did not show alone. Minimal urine in Foley bag. CT scan was obtained that shows that foley is in appropriate position and bladder. No obstructing lesion identified. CT head is normal.  Patient given IV fluids to treat his dehydration and hypernatremia. We'll also start D5 water. D/w Dr. Shanon Brow.   CRITICAL CARE Performed by: Ezequiel Essex Total critical care time: 45 Critical care time was exclusive of separately billable procedures and treating other patients. Critical care was necessary to treat or prevent imminent or life-threatening deterioration. Critical care was time spent personally by me on the following activities: development of treatment plan with patient and/or surrogate as well as nursing, discussions with consultants, evaluation of patient's response to treatment, examination of patient, obtaining history from patient or surrogate, ordering and performing treatments and interventions, ordering and review of laboratory studies, ordering and review of radiographic studies, pulse oximetry and re-evaluation of patient's condition.     Ezequiel Essex, MD 04/07/15 [phone]

## 2015-04-06 NOTE — ED Notes (Signed)
Dr.Rancour notified of lactic acid of 3.0

## 2015-04-06 NOTE — Progress Notes (Signed)
Pt admitted to the unit. Pt is asleep. Call bell in place. Educated to call for any assistance. Bed in lowest position, call bell within reach- will continue to monitor.

## 2015-04-07 ENCOUNTER — Encounter (HOSPITAL_COMMUNITY): Payer: Self-pay | Admitting: Physician Assistant

## 2015-04-07 ENCOUNTER — Inpatient Hospital Stay (HOSPITAL_COMMUNITY): Payer: Medicare Other

## 2015-04-07 DIAGNOSIS — N390 Urinary tract infection, site not specified: Secondary | ICD-10-CM

## 2015-04-07 DIAGNOSIS — G309 Alzheimer's disease, unspecified: Secondary | ICD-10-CM

## 2015-04-07 DIAGNOSIS — N179 Acute kidney failure, unspecified: Principal | ICD-10-CM

## 2015-04-07 DIAGNOSIS — G9340 Encephalopathy, unspecified: Secondary | ICD-10-CM | POA: Insufficient documentation

## 2015-04-07 DIAGNOSIS — R7989 Other specified abnormal findings of blood chemistry: Secondary | ICD-10-CM

## 2015-04-07 DIAGNOSIS — E860 Dehydration: Secondary | ICD-10-CM

## 2015-04-07 DIAGNOSIS — E870 Hyperosmolality and hypernatremia: Secondary | ICD-10-CM

## 2015-04-07 LAB — BASIC METABOLIC PANEL
ANION GAP: 15 (ref 5–15)
Anion gap: 17 — ABNORMAL HIGH (ref 5–15)
Anion gap: 12 (ref 5–15)
Anion gap: 9 (ref 5–15)
Anion gap: 8 (ref 5–15)
BUN: 98 mg/dL — AB (ref 6–23)
BUN: 93 mg/dL — AB (ref 6–23)
BUN: 94 mg/dL — ABNORMAL HIGH (ref 6–23)
BUN: 90 mg/dL — AB (ref 6–23)
BUN: 86 mg/dL — ABNORMAL HIGH (ref 6–23)
CALCIUM: 10.6 mg/dL — AB (ref 8.4–10.5)
CALCIUM: 10.3 mg/dL (ref 8.4–10.5)
CALCIUM: 9.8 mg/dL (ref 8.4–10.5)
CALCIUM: 9.2 mg/dL (ref 8.4–10.5)
CHLORIDE: 127 mmol/L — AB (ref 96–112)
CHLORIDE: 128 mmol/L — AB (ref 96–112)
CO2: 25 mmol/L (ref 19–32)
CO2: 23 mmol/L (ref 19–32)
CO2: 27 mmol/L (ref 19–32)
CO2: 24 mmol/L (ref 19–32)
CO2: 25 mmol/L (ref 19–32)
CREATININE: 3.5 mg/dL — AB (ref 0.50–1.35)
CREATININE: 3.58 mg/dL — AB (ref 0.50–1.35)
Calcium: 9.9 mg/dL (ref 8.4–10.5)
Chloride: 127 mmol/L — ABNORMAL HIGH (ref 96–112)
Chloride: 129 mmol/L — ABNORMAL HIGH (ref 96–112)
Chloride: 130 mmol/L — ABNORMAL HIGH (ref 96–112)
Creatinine, Ser: 3.79 mg/dL — ABNORMAL HIGH (ref 0.50–1.35)
Creatinine, Ser: 3.3 mg/dL — ABNORMAL HIGH (ref 0.50–1.35)
Creatinine, Ser: 3.2 mg/dL — ABNORMAL HIGH (ref 0.50–1.35)
GFR calc Af Amer: 15 mL/min — ABNORMAL LOW (ref >90)
GFR calc Af Amer: 17 mL/min — ABNORMAL LOW (ref >90)
GFR calc Af Amer: 18 mL/min — ABNORMAL LOW (ref >90)
GFR calc Af Amer: 19 mL/min — ABNORMAL LOW (ref >90)
GFR calc non Af Amer: 13 mL/min — ABNORMAL LOW (ref >90)
GFR calc non Af Amer: 15 mL/min — ABNORMAL LOW (ref >90)
GFR calc non Af Amer: 14 mL/min — ABNORMAL LOW (ref >90)
GFR, EST AFRICAN AMERICAN: 16 mL/min — AB (ref >90)
GFR, EST NON AFRICAN AMERICAN: 16 mL/min — AB (ref >90)
GFR, EST NON AFRICAN AMERICAN: 16 mL/min — AB (ref >90)
Glucose, Bld: 108 mg/dL — ABNORMAL HIGH (ref 70–99)
Glucose, Bld: 123 mg/dL — ABNORMAL HIGH (ref 70–99)
Glucose, Bld: 155 mg/dL — ABNORMAL HIGH (ref 70–99)
Glucose, Bld: 141 mg/dL — ABNORMAL HIGH (ref 70–99)
Glucose, Bld: 148 mg/dL — ABNORMAL HIGH (ref 70–99)
POTASSIUM: 5.8 mmol/L — AB (ref 3.5–5.1)
Potassium: 4 mmol/L (ref 3.5–5.1)
Potassium: 4 mmol/L (ref 3.5–5.1)
Potassium: 3.8 mmol/L (ref 3.5–5.1)
Potassium: 3.7 mmol/L (ref 3.5–5.1)
SODIUM: 162 mmol/L — AB (ref 135–145)
Sodium: 167 mmol/L (ref 135–145)
Sodium: 168 mmol/L (ref 135–145)
Sodium: 166 mmol/L (ref 135–145)
Sodium: 163 mmol/L (ref 135–145)

## 2015-04-07 LAB — CBC
HEMATOCRIT: 41.9 % (ref 39.0–52.0)
Hemoglobin: 12.7 g/dL — ABNORMAL LOW (ref 13.0–17.0)
MCH: 30.6 pg (ref 26.0–34.0)
MCHC: 30.3 g/dL (ref 30.0–36.0)
MCV: 101 fL — AB (ref 78.0–100.0)
Platelets: 75 10*3/uL — ABNORMAL LOW (ref 150–400)
RBC: 4.15 MIL/uL — ABNORMAL LOW (ref 4.22–5.81)
RDW: 16 % — ABNORMAL HIGH (ref 11.5–15.5)
WBC: 7.7 10*3/uL (ref 4.0–10.5)

## 2015-04-07 LAB — MRSA PCR SCREENING: MRSA BY PCR: NEGATIVE

## 2015-04-07 LAB — BLOOD GAS, ARTERIAL
ACID-BASE DEFICIT: 0.7 mmol/L (ref 0.0–2.0)
Bicarbonate: 23.9 mEq/L (ref 20.0–24.0)
O2 SAT: 99.1 %
PATIENT TEMPERATURE: 98.6
PO2 ART: 300 mmHg — AB (ref 80.0–100.0)
TCO2: 25.2 mmol/L (ref 0–100)
pCO2 arterial: 42.8 mmHg (ref 35.0–45.0)
pH, Arterial: 7.366 (ref 7.350–7.450)

## 2015-04-07 LAB — TROPONIN I
TROPONIN I: 0.11 ng/mL — AB (ref <0.031)
Troponin I: 0.15 ng/mL — ABNORMAL HIGH (ref <0.031)
Troponin I: 0.18 ng/mL — ABNORMAL HIGH (ref <0.031)

## 2015-04-07 LAB — LACTIC ACID, PLASMA: LACTIC ACID, VENOUS: 1.7 mmol/L (ref 0.5–2.0)

## 2015-04-07 MED ORDER — ASPIRIN 300 MG RE SUPP
300.0000 mg | Freq: Every day | RECTAL | Status: DC
  Administered 2015-04-07: 300 mg via RECTAL
  Filled 2015-04-07 (×2): qty 1

## 2015-04-07 MED ORDER — SODIUM POLYSTYRENE SULFONATE 15 GM/60ML PO SUSP
30.0000 g | Freq: Once | ORAL | Status: DC
  Filled 2015-04-07: qty 120

## 2015-04-07 MED ORDER — ASPIRIN 300 MG RE SUPP
300.0000 mg | Freq: Once | RECTAL | Status: AC
  Administered 2015-04-07: 300 mg via RECTAL
  Filled 2015-04-07: qty 1

## 2015-04-07 MED ORDER — DEXTROSE-NACL 5-0.45 % IV SOLN
INTRAVENOUS | Status: DC
  Administered 2015-04-07 (×2): 100 mL/h via INTRAVENOUS

## 2015-04-07 MED ORDER — DEXTROSE 5 % IV SOLN
INTRAVENOUS | Status: DC
  Administered 2015-04-07: 23:00:00 via INTRAVENOUS

## 2015-04-07 MED FILL — Medication: Qty: 1 | Status: AC

## 2015-04-07 NOTE — Progress Notes (Signed)
CRITICAL VALUE ALERT    Critical value received:  167  Date of notification:  04/07/2015  Time of notification:  0223  Critical value read back:Yes.    Nurse who received alert:  Steffanie Dunn  MD notified (1st page):  Np Baltazar Najjar  Time of first page:  0223  MD notified (2nd page):  Time of second page:  Responding MD:  Np has not return page as of this time  Time MD responded:

## 2015-04-07 NOTE — Progress Notes (Signed)
CRITICAL VALUE ALERT  Critical value received:  Sodium 168  Date of notification:  04/07/15  Time of notification:  0345   Critical value read back:Yes.    Nurse who received alert:  Thurmond Butts   MD notified (1st page):  Dr. Donna Bernard  Time of first page:  0350   Time MD responded:  [phone]

## 2015-04-07 NOTE — Progress Notes (Signed)
RN and tech had been trying to get a temp on patient since he arrived. When RN turned patient to do a rectal, patient went unconscious. Sternal rub was done and patient did not wake up. Rapid was called and Nevin Bloodgood was at bedside. When patient did not awaken, a code was called. Just as the CPR board was placed under patient, he became aroused and started to fight back- code was cancelled. RN tried to take rectal temp again and received a reading of 94.7. Patient then went unconscious again- code was called a second time. As staff prepared for CPR, patient came to and started to say "stop". Code was cancelled again. Patient was very cold and warm blankets were added. Bolus given. Blood gas taken. When trying to get o2 stat- all staff was unsuccessful. o2 was not reading, neither was temp. NP Baltazar Najjar was called. MD Blaine Hamper showed up to assess patient. Orders put in for step down. NP Eddie Dibbles consulted just in case patient needed ICU bed. Will call report and transfer patient.

## 2015-04-07 NOTE — Significant Event (Addendum)
Rapid Response Event Note  Overview: Time Called: 0109 Event Type: Neurologic, Hypotension, Other (Comment), Cardiac, Respiratory (hypothermic)  Initial Focused Assessment: Called by Juliann Pulse, RN that pt is nonresponsive, with BP 70/40 and 80% 02 sats on 6 l [place].  Staff was apparently attempting to obtain rectal temperature when pt went unconscious. Upon my arrival to room pt is cold to touch, unresponsive to sternal rub,  RR 6 with poor chest excursion. SR 66 on monitor with faint palpable pulses   Unable to obtainO2 sat at present time.  IV NSS at 100 cc hr infusing.  Urine hematuric with 100 cc output since insertions of foley at 1800 hr. Prepared to call [employer] for assistance  and as back board was placed under pt he awoke yelling. Code Blue canceled .Rectal temp obtained reading  94.7. Dr Blaine Hamper at bedside. Pt to be transferred off unit for [facility] [place] warming. Seen by CCM  At bedside.  Pt to be admitted to SDU,    Interventions:  NSS 250 cc IV bolus,  100% NRBMask. Stat PCXR, ABG  : 7.36  PCO2 43  PO2  300.  Pt placed back on 4 l [place] with 02 sats 97%..  Obtained rectal temp  0300  122/59  66 SR 12  100% 4 l [place]   Pt transported to 2H21 at 0315 hrs with monitor and oxygen.  Report given to [place], RN   Event Summary: Name of Physician Notified: Dr Blaine Hamper at [phone]    at         Ester Rink

## 2015-04-07 NOTE — Progress Notes (Signed)
Called by Sydell Axon, RN for a second set of eyes on this pt who had just arrived onto unit from ED.  Pt admitted with acute urinary retention, renal failure and gross hematuria from [facility] with hx of Alzheimer's dementia. ? Baseline. Pt is drowsy but able to awake, state full name and follow commands.  MAE weakly. Bilateral BS clear, O2 sats 100% on RA  RR 16  HR 76 and regular,  Skin cool and dry.   NSS infusing at 100 cc hr  BP 128/58.  Received NSS 1 liter in ED for LA 3.0, NA 166, BUN 102, CR 4.3   Foley with hematuric urine but clearing.  Repeat LA 1.7  Rectal temp pending.  Pt stable at present and able to stay on floor.  Hand off report given to Sydell Axon, Therapist, sports.  Will continue to follow as needed.

## 2015-04-07 NOTE — Progress Notes (Signed)
INITIAL NUTRITION ASSESSMENT  DOCUMENTATION CODES Per approved criteria  -Severe malnutrition in the context of chronic illness   Pt meets criteria for severe  MALNUTRITION in the context of chronic illness as evidenced by severe fat and muscle depletion, 17% wt loss x 6 months.  INTERVENTION: -RD will follow for diet advancement and goals of care -Supplement diet as appropriate  NUTRITION DIAGNOSIS: Malnutrition related to predicted suboptimal nutrient intake as evidenced by severe fat and muscle depletion, 17% wt loss x 6 months.   Goal: Pt will meet >90% of estimated nutritional needs  Monitor:  Diet advancement, PO/supplement intake, labs, weight changes, I/O's, goal of care  Reason for Assessment: Low Braden  81 y.o. male  Admitting Dx: Acute renal failure  81 yo male adv dementia sent in from snf with worsening dehydration, na level over 160, cr up to 4 from prev nml.. No report of fevers. No n/v/d. Patient cannot provide any history due to chronic dementia. A foley was attempted to be placed at SNF which was traumatic and his penis started bleeding. He was actually sent here because his bleeding would not stop. It has since stopped in the ED. Pt appears to be at his baseline mental status despite the markedly abnormal lab values. He complains of no pain. Appears comfortable. Knows his name. Foley has been placed in ED and position verified with ct scan as it was difficult for dr rancour to verify with bedside ultrasound.   ASSESSMENT: Pt admitted from [facility] with ARF, acute urinary retention, and dehydration.  Pt was transferred to SDU after rapid response was called due to pt becoming unresponsive in ED. MD has placed call to HCPOA to discuss code status. Pt lethargic and unable to participate in interview due to cognitive deficit. Reviewed nursing home records, which revealed that pt has a hx of weight loss and pressure ulcers. Wt hx reveals UBW of  around 150# and a 27# (17%) wt loss over the past 6 months.  Labs reviewed. Na: 166, Cl: 127, BUN/Creat: 94/3.58, Glucose: 155.  Nutrition Focused Physical Exam:  Subcutaneous Fat:  Orbital Region: severe depletion Upper Arm Region: severe depletion Thoracic and Lumbar Region: moderate depletion  Muscle:  Temple Region: severe depletion Clavicle Bone Region: severe depletion Clavicle and Acromion Bone Region: severe depletion Scapular Bone Region: severe depletion Dorsal Hand: severe depletion Patellar Region: severe depletion Anterior Thigh Region: severe depletion Posterior Calf Region: severe depletion  Edema: none present   Height: Ht Readings from Last 1 Encounters:  10/11/14 5\' 4"  (1.626 m)    Weight: Wt Readings from Last 1 Encounters:  04/07/15 127 lb 13.9 oz (58 kg)    Ideal Body Weight: 130#  % Ideal Body Weight: 98%  Wt Readings from Last 10 Encounters:  04/07/15 127 lb 13.9 oz (58 kg)  10/11/14 154 lb 6.4 oz (70.035 kg)  04/15/14 158 lb (71.668 kg)  07/10/13 148 lb 9.6 oz (67.405 kg)  08/07/11 120 lb (54.432 kg)    Usual Body Weight: 150#  % Usual Body Weight: 85%  BMI:  Body mass index is 21.94 kg/(m^2). Normal weight range  Estimated Nutritional Needs: Kcal: 1500-1700 Protein: 70-80 grams Fluid: 1.5-1.7 L  Skin: Intact  Diet Order: Diet clear liquid Room service appropriate?: Yes; Fluid consistency:: Thin  EDUCATION NEEDS: -Education not appropriate at this time   Intake/Output Summary (Last 24 hours) at 04/07/15 0910 Last data filed at 04/07/15 0850  Gross per 24 hour  Intake  2200 ml  Output    176 ml  Net   2024 ml    Last BM: PTA  Labs:   Recent Labs Lab 04/07/15 0137 04/07/15 0350 04/07/15 0500  NA 167* 168* 166*  K 5.8* 4.0 4.0  CL 127* 128* 127*  CO2 25 23 27   BUN 98* 93* 94*  CREATININE 3.79* 3.50* 3.58*  CALCIUM 10.6* 10.3 9.9  GLUCOSE 108* 123* 155*    CBG (last 3)  No results for input(s): GLUCAP in  the last 72 hours.  Scheduled Meds: . aspirin  300 mg Rectal Daily  . cefTRIAXone (ROCEPHIN)  IV  1 g Intravenous Q24H  . sodium chloride  3 mL Intravenous Q12H    Continuous Infusions: . dextrose 5 % and 0.45% NaCl 100 mL/hr at 04/07/15 0800    Past Medical History  Diagnosis Date  . Vitamin D deficiency   . Chronic lung disease   . Thrombocytopenia   . Anemia   . Dementia   . Glaucoma     chronic angle closure  . Cataract   . Osteoporosis   . Weight loss   . Ulcer     Pressure Ulcers on various body areas  . Alzheimer's disease   . Unspecified constipation   . Incomplete bladder emptying   . Injury to tibial vessel(s), unspecified   . Unspecified vitamin B deficiency   . Pressure ulcer, unspecified site(707.00)   . Depressive disorder, not elsewhere classified   . Unspecified cataract   . Malignant neoplasm of prostate   . Impotence of organic origin   . Alzheimer's disease   . Chronic kidney disease, unspecified   . PAD (peripheral artery disease) 2012    Past Surgical History  Procedure Laterality Date  . Eye surgery  cataract surgery bilateral   2012  . Peripheral vascular catheterization  08-23-2011    R-RA >50%, tortuous L iliac, L-SFA, L-pop diseased, L-PT & L-ant tibial 100%, diseased L peroneal & R peroneal    Terrea Bruster A. Jimmye Norman, RD, LDN, CDE Pager: [phone] After hours Pager: [phone]

## 2015-04-07 NOTE — Progress Notes (Signed)
Brief progressive note, hospitalist, [facility]   was called to see patient due to that patient was found to be unresponsive. I was told that when nurse was attempting to obtain rectal temperature, he went unconscious. Upon my arrival to room. He is responsive to pain stimulation by sternal rubbing. That ABG showed a pH 7.366, PCO2 or due to 0.8, PO2 300. Chest x-ray is negative for acute abnormalities. Patient was found to have hypothermia, with rectal temperature 94.5. I discussed with PCCM, Dr. Waymon Amato, who suggested to transfer to SDU.   Subjective: Patient has altered mental status, responsive to pain stimulation. Not complaining any pain.  Objective: Vital signs in last 24 hours: Filed Vitals:   04/07/15 0142 04/07/15 0300 04/07/15 0500 04/07/15 0600  BP: 106/54 122/59 107/93 110/43  Pulse: 66 66 61 59  Temp:  94.8 F (34.9 C) 93.9 F (34.4 C) 95.1 F (35.1 C)  TempSrc:  Rectal Rectal Rectal  Resp:   20 15  Weight:  58 kg (127 lb 13.9 oz)    SpO2: 100% 100% 100% 100%   Weight change:   Intake/Output Summary (Last 24 hours) at 04/07/15 0657 Last data filed at 04/07/15 0600  Gross per 24 hour  Intake   2000 ml  Output    101 ml  Net   1899 ml    Physical Exam:   Filed Vitals:   04/07/15 0142 04/07/15 0300 04/07/15 0500 04/07/15 0600  BP: 106/54 122/59 107/93 110/43  Pulse: 66 66 61 59  Temp:  94.8 F (34.9 C) 93.9 F (34.4 C) 95.1 F (35.1 C)  TempSrc:  Rectal Rectal Rectal  Resp:   20 15  Weight:  58 kg (127 lb 13.9 oz)    SpO2: 100% 100% 100% 100%    General: Not in acute distress HEENT: PERRL, EOMI, no scleral icterus, No JVD or bruit Cardiac: S1/S2, RRR, No murmurs, gallops or rubs Pulm: Good air movement bilaterally. Clear to auscultation bilaterally. No rales, wheezing, rhonchi or rubs. Abd: Soft, nondistended, nontender, no rebound pain, no organomegaly, BS present Ext: No edema. 2+DP/PT pulse bilaterally Musculoskeletal: No joint deformities, erythema, or  stiffness, ROM full Skin: No rashes.  Neuro: only responsive to pain stimulation. Moves all extremities.  Lab Results: @labtest2 @ Micro Results: Recent Results (from the past 240 hour(s))  MRSA PCR Screening     Status: None   Collection Time: 04/06/15 11:45 PM  Result Value Ref Range Status   MRSA by PCR NEGATIVE NEGATIVE Final    Comment:        The GeneXpert MRSA Assay (FDA approved for NASAL specimens only), is one component of a comprehensive MRSA colonization surveillance program. It is not intended to diagnose MRSA infection nor to guide or monitor treatment for MRSA infections.    Studies/Results: Ct Abdomen Pelvis Wo Contrast  04/06/2015   CLINICAL DATA:  Acute renal failure and bleeding from penis during Foley catheter placement  EXAM: CT ABDOMEN AND PELVIS WITHOUT CONTRAST  TECHNIQUE: Multidetector CT imaging of the abdomen and pelvis was performed following the standard protocol without IV contrast.  COMPARISON:  None.  FINDINGS: Lung bases demonstrate very minimal atelectasis in the right lower lobe.  The liver, gallbladder, spleen, adrenal glands and pancreas are within normal limits. The kidneys are well visualized bilaterally. Right renal pelvic calculi are noted without obstructive change. The right ureter appears within normal limits. The bladder is decompressed by Foley catheter. Bilateral renal cystic change is noted.  Aortoiliac calcifications are seen  without aneurysmal dilatation. The appendix is within normal limits. Diverticular change is noted without diverticulitis.  IMPRESSION: Right renal pelvic calculi without obstructive change.  Bilateral renal cysts.  Diverticulosis without diverticulitis.  No other focal abnormality is seen.   Electronically Signed   By: Inez Catalina M.D.   On: 04/06/2015 21:04   Dg Chest 1 View  04/06/2015   CLINICAL DATA:  Acute renal failure  EXAM: CHEST  1 VIEW  COMPARISON:  03/24/2011  FINDINGS: The heart size and mediastinal  contours are within normal limits. Both lungs are clear. The visualized skeletal structures are unremarkable.  IMPRESSION: No active disease.   Electronically Signed   By: Inez Catalina M.D.   On: 04/06/2015 16:39   Ct Head Wo Contrast  04/06/2015   CLINICAL DATA:  Acute renal failure. Initially unresponsive. Altered mental status.  EXAM: CT HEAD WITHOUT CONTRAST  TECHNIQUE: Contiguous axial images were obtained from the base of the skull through the vertex without intravenous contrast.  COMPARISON:  02/20/2007  FINDINGS: The brain shows generalized atrophy. There chronic small-vessel ischemic changes of the deep white matter. No sign of acute infarction, mass lesion, hemorrhage, hydrocephalus or extra-axial collection. Physiologic calcification of the basal ganglia. Prominent dural calcification. Neither is significant. There is atherosclerotic calcification of the major vessels at the base of the brain.  IMPRESSION: Generalized atrophy. Chronic small vessel disease. No acute or reversible finding.   Electronically Signed   By: Nelson Chimes M.D.   On: 04/06/2015 21:00   Dg Chest Port 1 View  04/07/2015   CLINICAL DATA:  Decreased oxygen saturation  EXAM: PORTABLE CHEST - 1 VIEW  COMPARISON:  04/06/2015  FINDINGS: No cardiomegaly. Mild dextrocardia. Aortic and hilar contours are further distorted by rightward rotation.  Lucent appearing lungs which could reflect emphysematous change. There is no edema, consolidation, effusion, or pneumothorax. No acute osseous findings.  IMPRESSION: No active disease.   Electronically Signed   By: Monte Fantasia M.D.   On: 04/07/2015 01:42   Medications:  Scheduled Meds: . cefTRIAXone (ROCEPHIN)  IV  1 g Intravenous Q24H  . sodium chloride  3 mL Intravenous Q12H   Continuous Infusions: . dextrose 5 % and 0.45% NaCl 100 mL/hr (04/07/15 0332)   PRN Meds:. Assessment/Plan:  Acute encephalopathy: It is likely due to combination of hypernatremia and UTI/sepsis. Patient  was found to have hypothermia, with rectal temperature 94.5. His initial sodium level was 166, the repeated BMP at 3:50 AM showed Na 168.  -transfer pt to SDU and start warming Bair hugger -continue to treat UTI with IV abx -switch NS to D5-1/2 NS at  100cc/h -repeat BMP q4h -Neuro checks every 2 hours  New Onset Afib: The repeated the BMP showed hyperkalemia, therefore stat EKG was obtained, which showed new onset of A. Fib. Patient has slightly elevated troponin at 0.12. The BMP was repeated again, which showed normal potassium, the initial hyperkalemia finding is likely due to hemolysis. Not sure whether pt has chest pain or not due to AMS. -give aspirin per rectal -consulted to Card, need to follow up recs -trop x 3    LOS: 1 day   Ivor Costa [facility], hospitalist 04/07/2015, 6:57 AM

## 2015-04-07 NOTE — Care Management Note (Signed)
    Page 1 of 1   04/07/2015     8:54:13 AM CARE MANAGEMENT NOTE 04/07/2015  Patient:  [name], [name]   Account Number:  [id number]  Date Initiated:  04/07/2015  Documentation initiated by:  Elissa Hefty  Subjective/Objective Assessment:   adm w renal fialure hypernatremia     Action/Plan:   from nsg facility   Anticipated DC Date:     Anticipated DC Plan:  [place] referral  Clinical Social Worker         Choice offered to / List presented to:             Status of service:   Medicare Important Message given?   (If response is "NO", the following Medicare IM given date fields will be blank) Date Medicare IM given:   Medicare IM given by:   Date Additional Medicare IM given:   Additional Medicare IM given by:    Discharge Disposition:    Per UR Regulation:  Reviewed for med. necessity/level of care/duration of stay  If discussed at [place] of Stay Meetings, dates discussed:    Comments:

## 2015-04-07 NOTE — Progress Notes (Signed)
Patient transferred to [facility].

## 2015-04-07 NOTE — Progress Notes (Signed)
CRITICAL VALUE ALERT  Critical value received:  NA 166  Date of notification:  04/07/15  Time of notification:  0715   Critical value read back:Yes.    Nurse who received alert:  Thurmond Butts   MD notified (1st page):  [name]  Time of first page:  0720   Time MD responded:  [phone]

## 2015-04-07 NOTE — Progress Notes (Addendum)
PROGRESS NOTE  [name]:[id number] DOB: 05-29-1926 DOA: 04/06/2015 PCP: Tona Sensing, MD  Assessment/Plan: Acute renal failure- From dehydration. Ivf. Also has uti, tx with rocephin, uc sent. -hematuria due to traumatic foley insertion  Alzheimer's dementia without behavioral disturbance  Iron deficiency anemia  Hypernatremia-D5W and free water, BMP q 12  Dehydration, severe- As above  Elevated troponin- Serial trop, likely due to decline in renal function  UTI (lower urinary tract infection)- Rocephin, uc sent. New onset a fib- cardiology consulted  -baseline per PCP notes is patient can push self around in a wheelchair  Code Status: full Family Communication: called MPOA (neice- straight to VM)- will need to discuss code status Disposition Plan:    Consultants:  cards  Procedures:      HPI/Subjective: "I feel fine"  Objective: Filed Vitals:   04/07/15 0725  BP: 91/54  Pulse: 64  Temp: 96.5 F (35.8 C)  Resp: 17    Intake/Output Summary (Last 24 hours) at 04/07/15 0823 Last data filed at 04/07/15 0800  Gross per 24 hour  Intake   2200 ml  Output    176 ml  Net   2024 ml   Filed Weights   04/06/15 2158 04/07/15 0300  Weight: 57.5 kg (126 lb 12.2 oz) 58 kg (127 lb 13.9 oz)    Exam:   General:  Will awaken  Cardiovascular: irr  Respiratory: clear  Abdomen: thin, + BS  Musculoskeletal: no edema  Foley with bloddy urine  Data Reviewed: Basic Metabolic Panel:  Recent Labs Lab 04/06/15 1709 04/06/15 1737 04/07/15 0137 04/07/15 0350 04/07/15 0500  NA 166* 166* 167* 168* 166*  K 4.6 4.2 5.8* 4.0 4.0  CL 123* 125* 127* 128* 127*  CO2 24  --  25 23 27   GLUCOSE 114* 112* 108* 123* 155*  BUN 102* 88* 98* 93* 94*  CREATININE 4.33* 4.00* 3.79* 3.50* 3.58*  CALCIUM 11.9*  --  10.6* 10.3 9.9   Liver Function Tests:  Recent Labs Lab 04/06/15 1709  AST 51*  ALT 44  ALKPHOS 75  BILITOT 0.9  PROT 8.5*   ALBUMIN 4.3   No results for input(s): LIPASE, AMYLASE in the last 168 hours. No results for input(s): AMMONIA in the last 168 hours. CBC:  Recent Labs Lab 04/06/15 1709 04/06/15 1737 04/07/15 0500  WBC 8.8  --  7.7  NEUTROABS 4.8  --   --   HGB 17.2* 18.4* 12.7*  HCT 54.2* 54.0* 41.9  MCV 100.7*  --  101.0*  PLT 90*  --  PENDING   Cardiac Enzymes:  Recent Labs Lab 04/07/15 0350  TROPONINI 0.11*   BNP (last 3 results) No results for input(s): BNP in the last 8760 hours.  ProBNP (last 3 results) No results for input(s): PROBNP in the last 8760 hours.  CBG: No results for input(s): GLUCAP in the last 168 hours.  Recent Results (from the past 240 hour(s))  MRSA PCR Screening     Status: None   Collection Time: 04/06/15 11:45 PM  Result Value Ref Range Status   MRSA by PCR NEGATIVE NEGATIVE Final    Comment:        The GeneXpert MRSA Assay (FDA approved for NASAL specimens only), is one component of a comprehensive MRSA colonization surveillance program. It is not intended to diagnose MRSA infection nor to guide or monitor treatment for MRSA infections.      Studies: Ct Abdomen Pelvis Wo Contrast  04/06/2015   CLINICAL DATA:  Acute renal failure and bleeding from penis during Foley catheter placement  EXAM: CT ABDOMEN AND PELVIS WITHOUT CONTRAST  TECHNIQUE: Multidetector CT imaging of the abdomen and pelvis was performed following the standard protocol without IV contrast.  COMPARISON:  None.  FINDINGS: Lung bases demonstrate very minimal atelectasis in the right lower lobe.  The liver, gallbladder, spleen, adrenal glands and pancreas are within normal limits. The kidneys are well visualized bilaterally. Right renal pelvic calculi are noted without obstructive change. The right ureter appears within normal limits. The bladder is decompressed by Foley catheter. Bilateral renal cystic change is noted.  Aortoiliac calcifications are seen without aneurysmal dilatation.  The appendix is within normal limits. Diverticular change is noted without diverticulitis.  IMPRESSION: Right renal pelvic calculi without obstructive change.  Bilateral renal cysts.  Diverticulosis without diverticulitis.  No other focal abnormality is seen.   Electronically Signed   By: Inez Catalina M.D.   On: 04/06/2015 21:04   Dg Chest 1 View  04/06/2015   CLINICAL DATA:  Acute renal failure  EXAM: CHEST  1 VIEW  COMPARISON:  03/24/2011  FINDINGS: The heart size and mediastinal contours are within normal limits. Both lungs are clear. The visualized skeletal structures are unremarkable.  IMPRESSION: No active disease.   Electronically Signed   By: Inez Catalina M.D.   On: 04/06/2015 16:39   Ct Head Wo Contrast  04/06/2015   CLINICAL DATA:  Acute renal failure. Initially unresponsive. Altered mental status.  EXAM: CT HEAD WITHOUT CONTRAST  TECHNIQUE: Contiguous axial images were obtained from the base of the skull through the vertex without intravenous contrast.  COMPARISON:  02/20/2007  FINDINGS: The brain shows generalized atrophy. There chronic small-vessel ischemic changes of the deep white matter. No sign of acute infarction, mass lesion, hemorrhage, hydrocephalus or extra-axial collection. Physiologic calcification of the basal ganglia. Prominent dural calcification. Neither is significant. There is atherosclerotic calcification of the major vessels at the base of the brain.  IMPRESSION: Generalized atrophy. Chronic small vessel disease. No acute or reversible finding.   Electronically Signed   By: Nelson Chimes M.D.   On: 04/06/2015 21:00   Dg Chest Port 1 View  04/07/2015   CLINICAL DATA:  Decreased oxygen saturation  EXAM: PORTABLE CHEST - 1 VIEW  COMPARISON:  04/06/2015  FINDINGS: No cardiomegaly. Mild dextrocardia. Aortic and hilar contours are further distorted by rightward rotation.  Lucent appearing lungs which could reflect emphysematous change. There is no edema, consolidation, effusion, or  pneumothorax. No acute osseous findings.  IMPRESSION: No active disease.   Electronically Signed   By: Monte Fantasia M.D.   On: 04/07/2015 01:42    Scheduled Meds: . aspirin  300 mg Rectal Daily  . cefTRIAXone (ROCEPHIN)  IV  1 g Intravenous Q24H  . sodium chloride  3 mL Intravenous Q12H   Continuous Infusions: . dextrose 5 % and 0.45% NaCl 100 mL/hr at 04/07/15 0800   Antibiotics Given (last 72 hours)    None      Principal Problem:   Acute renal failure Active Problems:   Alzheimer's dementia without behavioral disturbance   Iron deficiency anemia   Hypernatremia   Dehydration, severe   Elevated troponin   UTI (lower urinary tract infection)   Acute encephalopathy    Time spent: 35 min    VANN, [place] [facility] Pager [phone]. If 7PM-7AM, please contact night-coverage at [email or web address], password [facility] 04/07/2015, 8:23 AM  LOS: 1 day

## 2015-04-07 NOTE — Progress Notes (Signed)
Paged by RN about shallow breaths and unresponsiveness of pt. Called back immediately and CODE blue had been called. This NP on different campus tonight, so Dr. Shanon Brow and Dr. Blaine Hamper of [facility] paged to go to the Code. KJKG, NP [facility]

## 2015-04-08 ENCOUNTER — Inpatient Hospital Stay (HOSPITAL_COMMUNITY): Payer: Medicare Other

## 2015-04-08 LAB — BASIC METABOLIC PANEL
Anion gap: 14 (ref 5–15)
BUN: 75 mg/dL — ABNORMAL HIGH (ref 6–23)
CO2: 23 mmol/L (ref 19–32)
Calcium: 9.4 mg/dL (ref 8.4–10.5)
Chloride: 124 mmol/L — ABNORMAL HIGH (ref 96–112)
Creatinine, Ser: 3.11 mg/dL — ABNORMAL HIGH (ref 0.50–1.35)
GFR, EST AFRICAN AMERICAN: 19 mL/min — AB (ref >90)
GFR, EST NON AFRICAN AMERICAN: 17 mL/min — AB (ref >90)
Glucose, Bld: 144 mg/dL — ABNORMAL HIGH (ref 70–99)
Potassium: 3.5 mmol/L (ref 3.5–5.1)
SODIUM: 161 mmol/L — AB (ref 135–145)

## 2015-04-08 LAB — CBC
HCT: 40 % (ref 39.0–52.0)
HEMOGLOBIN: 12.2 g/dL — AB (ref 13.0–17.0)
MCH: 31 pg (ref 26.0–34.0)
MCHC: 30.5 g/dL (ref 30.0–36.0)
MCV: 101.8 fL — AB (ref 78.0–100.0)
Platelets: 67 10*3/uL — ABNORMAL LOW (ref 150–400)
RBC: 3.93 MIL/uL — AB (ref 4.22–5.81)
RDW: 15.7 % — ABNORMAL HIGH (ref 11.5–15.5)
WBC: 8.7 10*3/uL (ref 4.0–10.5)

## 2015-04-08 LAB — MAGNESIUM: MAGNESIUM: 2.1 mg/dL (ref 1.5–2.5)

## 2015-04-08 MED ORDER — MORPHINE SULFATE 2 MG/ML IJ SOLN
1.0000 mg | INTRAMUSCULAR | Status: DC | PRN
  Administered 2015-04-09 – 2015-04-10 (×3): 1 mg via INTRAVENOUS
  Filled 2015-04-08 (×3): qty 1

## 2015-04-08 MED ORDER — LORAZEPAM 2 MG/ML IJ SOLN
1.0000 mg | INTRAMUSCULAR | Status: DC | PRN
Start: 2015-04-08 — End: 2015-04-12
  Administered 2015-04-09 – 2015-04-10 (×2): 1 mg via INTRAVENOUS
  Filled 2015-04-08 (×2): qty 1

## 2015-04-08 MED ORDER — DEXTROSE 5 % IV SOLN
500.0000 mg | INTRAVENOUS | Status: DC
  Filled 2015-04-08: qty 500

## 2015-04-08 NOTE — Progress Notes (Signed)
CRITICAL VALUE ALERT  Critical value received:  Sodium 163  Date of notification:  04/08/15  Time of notification:  0657  Critical value read back: yes  Nurse who received alert:  Duanne Moron RN   This is an excepted find [facility] aware of pts sodium trends.

## 2015-04-08 NOTE — Progress Notes (Addendum)
Resident of [facility] where patient required SNF level of care.  Per MD note- patient is now comfort care per agreement of Medical Power of Attorney- Niece and that "death appears close."   CSW unable to talk to patient due to chronic dementia.  Will monitor and provide support as indicated.  CSW spoke with Florentina Jenny, Admissions Director of [facility] Updated on above information and she stated that should patient's condition should stablize-  they would accept patient back to SNF and could provide hospice care in the SNF.  MD not indicates probably hospital death and  comfort care in place.  Lorie Phenix. Pauline Good, [place]

## 2015-04-08 NOTE — Progress Notes (Signed)
Niece called and updated on patient's progress and plan of care by RN.   Elmarie Shiley R

## 2015-04-08 NOTE — Progress Notes (Signed)
Spoke with MPOA- niece.  Informed her patient was not doing well and she agreed with a comfort approach.  Morphine PRN added.  Ativan PRN added.  Will leave in SDU for now as death appears close.   Eulogio Bear

## 2015-04-08 NOTE — Progress Notes (Signed)
Pt work of breathing has suddenly increased; lungs auscultated, crackles heard bilaterally. O2 Stats dropping in the 80's. Upon calling [facility] Pt heart monitor alarmed he was in VT with a pulse for several secs then converted back to NSR. [facility] aware. Zoll and code cart at bedside. WIll continue to monitor pt.

## 2015-04-08 NOTE — Progress Notes (Signed)
RN paged earlier stating pt's lungs were "wet". He has been getting IVF for severe dehydration and high sodium. Tonight, we have changed his IVF to straight D5W because his sodium level was not decreasing on D51/2 NS. CXR was ordered and RN increased O2 to raise his O2 sats. Also, pt is here for AKI and his kidney function has not improved since admission despite of aggressive IV hydration. He has also had new Afib episodes and is being treated for a UTI. Pt with a baseline hx of advanced dementia and lives in a SNF. Tonight, pt developed VT with a long run of one minute then shorter runs afterwards.  His POA, Ms Prentiss Bells, is presently a pt in [facility] according to a family member.  This NP called [facility] and operator transferred me to a RN on the unit where the pt was supposedly residing. RN verified with the pt that she was indeed the niece of Mr. Ollis. Upon speaking with her, this NP verified that she is the [place] and niece of Mr. Mogel. Ms Hassell Done was able to tell this NP pt's DOB. Ms. Hassell Done did not know that Mr. Hessling was in the hospital. We had a long discussion of his reasons for being hospitalized and his course since here. Explained his condition with the elevated sodium and renal failure which is not improving in spite of our efforts. Explained that he has had a "dangerous, life threatening cardiac arrhythmia" tonight. After this discussion, Ms Prentiss Bells, agreed to make Mr. Phillippi a DNR/DNI. We discussed CPR and she has a full understanding of what this is as she went through it with her mother. She agrees it would not be in Mr. Gedney best interest to attempt resuscitation. Order changed.  Clance Boll, NP [facility] Contact numbers: Ms Jerlyn Ly 759 at [facility]. Direct # is [phone]. Or may call hospital operator at [phone] and ask for room 759. May also reach Mr. [name] (Anne's husband) at [phone].

## 2015-04-08 NOTE — Progress Notes (Addendum)
Family member came by to inform that Pt's MPOA, [name] iscurrently admitted at [facility]. She is unable to use her cell phone but can be reached on her hospital room line [phone]. She requests that the rounding MD calls her tomorrow to discuss the pt's plan of care.

## 2015-04-08 NOTE — Progress Notes (Addendum)
PROGRESS NOTE  CORLEONE BIEGLER SVX:[id number] DOB: [birth year] DOA: 04/06/2015 PCP: Tona Sensing, MD  81 yo male adv dementia sent in from snf with worsening dehydration, na level over 160, cr up to 4 from prev nml.. No report of fevers. No n/v/d. Patient cannot provide any history due to chronic dementia. A foley was attempted to be placed at SNF which was traumatic and his penis started bleeding. He was actually sent here because his bleeding would not stop.  Made DNR by MPOA.  Worsened overnight on 4/21 with chest  Assessment/Plan: Acute renal failure- From dehydration. Ivf.  -making minimal urine -nurse to flush foley and bladder scan   Alzheimer's dementia without behavioral disturbance  Iron deficiency anemia  Hypernatremia-D5W and free water, BMP q 12  Dehydration, severe- As above  Elevated troponin- Serial trop, likely due to decline in renal function  UTI (lower urinary tract infection)- Rocephin, uc sent. PNA?- infection vs hemorrhagic- CT scan pending Severe dementia -baseline per PCP notes is patient can push self around in a wheelchair  As patient's conditioning is worsening, will consult palliative care and recommend to family comfort care  Code Status: DNR Family Communication: called niece- asked to call back in 15 minutes [phone], Mr. Hassell Done was agreeable with making patient comfortable Disposition Plan:    Consultants:  cards  Procedures:      HPI/Subjective: Became less responsive overnight and had increased work of breathing  Objective: Filed Vitals:   04/08/15 0500  BP:   Pulse:   Temp:   Resp: 34    Intake/Output Summary (Last 24 hours) at 04/08/15 0759 Last data filed at 04/08/15 0500  Gross per 24 hour  Intake   1950 ml  Output    565 ml  Net   1385 ml   Filed Weights   04/06/15 2158 04/07/15 0300 04/08/15 0406  Weight: 57.5 kg (126 lb 12.2 oz) 58 kg (127 lb 13.9 oz) 59.2 kg (130 lb 8.2 oz)     Exam:   General:  Not responsive  Cardiovascular: rrr  Respiratory: coarse breath sounds  Abdomen: thin, + BS  Musculoskeletal: no edema  Foley with min urine  Data Reviewed: Basic Metabolic Panel:  Recent Labs Lab 04/07/15 0350 04/07/15 0500 04/07/15 1028 04/07/15 1757 04/08/15 0525  NA 168* 166* 162* 163* 161*  K 4.0 4.0 3.8 3.7 3.5  CL 128* 127* 129* 130* 124*  CO2 23 27 24 25 23   GLUCOSE 123* 155* 141* 148* 144*  BUN 93* 94* 90* 86* 75*  CREATININE 3.50* 3.58* 3.30* 3.20* 3.11*  CALCIUM 10.3 9.9 9.8 9.2 9.4  MG  --   --   --   --  2.1   Liver Function Tests:  Recent Labs Lab 04/06/15 1709  AST 51*  ALT 44  ALKPHOS 75  BILITOT 0.9  PROT 8.5*  ALBUMIN 4.3   No results for input(s): LIPASE, AMYLASE in the last 168 hours. No results for input(s): AMMONIA in the last 168 hours. CBC:  Recent Labs Lab 04/06/15 1709 04/06/15 1737 04/07/15 0500 04/08/15 0008  WBC 8.8  --  7.7 8.7  NEUTROABS 4.8  --   --   --   HGB 17.2* 18.4* 12.7* 12.2*  HCT 54.2* 54.0* 41.9 40.0  MCV 100.7*  --  101.0* 101.8*  PLT 90*  --  75* 67*   Cardiac Enzymes:  Recent Labs Lab 04/07/15 0350 04/07/15 1028 04/07/15 1757  TROPONINI 0.11* 0.15* 0.18*   BNP (last 3 results)  No results for input(s): BNP in the last 8760 hours.  ProBNP (last 3 results) No results for input(s): PROBNP in the last 8760 hours.  CBG: No results for input(s): GLUCAP in the last 168 hours.  Recent Results (from the past 240 hour(s))  Urine culture     Status: None (Preliminary result)   Collection Time: 04/06/15  5:48 PM  Result Value Ref Range Status   Specimen Description URINE, CATHETERIZED  Final   Special Requests NONE  Final   Colony Count   Final    >=100,000 COLONIES/ML Performed at [facility]    Culture   Final    [place] Performed at [facility]    Report Status PENDING  Incomplete  MRSA PCR Screening     Status: None   Collection  Time: 04/06/15 11:45 PM  Result Value Ref Range Status   MRSA by PCR NEGATIVE NEGATIVE Final    Comment:        The GeneXpert MRSA Assay (FDA approved for NASAL specimens only), is one component of a comprehensive MRSA colonization surveillance program. It is not intended to diagnose MRSA infection nor to guide or monitor treatment for MRSA infections.      Studies: Ct Abdomen Pelvis Wo Contrast  04/06/2015   CLINICAL DATA:  Acute renal failure and bleeding from penis during Foley catheter placement  EXAM: CT ABDOMEN AND PELVIS WITHOUT CONTRAST  TECHNIQUE: Multidetector CT imaging of the abdomen and pelvis was performed following the standard protocol without IV contrast.  COMPARISON:  None.  FINDINGS: Lung bases demonstrate very minimal atelectasis in the right lower lobe.  The liver, gallbladder, spleen, adrenal glands and pancreas are within normal limits. The kidneys are well visualized bilaterally. Right renal pelvic calculi are noted without obstructive change. The right ureter appears within normal limits. The bladder is decompressed by Foley catheter. Bilateral renal cystic change is noted.  Aortoiliac calcifications are seen without aneurysmal dilatation. The appendix is within normal limits. Diverticular change is noted without diverticulitis.  IMPRESSION: Right renal pelvic calculi without obstructive change.  Bilateral renal cysts.  Diverticulosis without diverticulitis.  No other focal abnormality is seen.   Electronically Signed   By: Inez Catalina M.D.   On: 04/06/2015 21:04   Dg Chest 1 View  04/06/2015   CLINICAL DATA:  Acute renal failure  EXAM: CHEST  1 VIEW  COMPARISON:  03/24/2011  FINDINGS: The heart size and mediastinal contours are within normal limits. Both lungs are clear. The visualized skeletal structures are unremarkable.  IMPRESSION: No active disease.   Electronically Signed   By: Inez Catalina M.D.   On: 04/06/2015 16:39   Ct Head Wo Contrast  04/06/2015    CLINICAL DATA:  Acute renal failure. Initially unresponsive. Altered mental status.  EXAM: CT HEAD WITHOUT CONTRAST  TECHNIQUE: Contiguous axial images were obtained from the base of the skull through the vertex without intravenous contrast.  COMPARISON:  02/20/2007  FINDINGS: The brain shows generalized atrophy. There chronic small-vessel ischemic changes of the deep white matter. No sign of acute infarction, mass lesion, hemorrhage, hydrocephalus or extra-axial collection. Physiologic calcification of the basal ganglia. Prominent dural calcification. Neither is significant. There is atherosclerotic calcification of the major vessels at the base of the brain.  IMPRESSION: Generalized atrophy. Chronic small vessel disease. No acute or reversible finding.   Electronically Signed   By: Nelson Chimes M.D.   On: 04/06/2015 21:00   Dg Chest [facility]  04/08/2015   CLINICAL DATA:  Hypoxia  EXAM: PORTABLE CHEST - 1 VIEW  COMPARISON:  04/07/2015  FINDINGS: Diffuse airspace disease throughout the right lung, worsening since prior study. Left lung is clear. Heart is normal size. No effusions. No acute bony abnormality.  IMPRESSION: Diffuse right lung airspace disease, new since prior study. This could represent infection or hemorrhage. Given the normal heart size and asymmetry, edema not likely.   Electronically Signed   By: Rolm Baptise M.D.   On: 04/08/2015 04:26   Dg Chest Port 1 View  04/07/2015   CLINICAL DATA:  Decreased oxygen saturation  EXAM: PORTABLE CHEST - 1 VIEW  COMPARISON:  04/06/2015  FINDINGS: No cardiomegaly. Mild dextrocardia. Aortic and hilar contours are further distorted by rightward rotation.  Lucent appearing lungs which could reflect emphysematous change. There is no edema, consolidation, effusion, or pneumothorax. No acute osseous findings.  IMPRESSION: No active disease.   Electronically Signed   By: Monte Fantasia M.D.   On: 04/07/2015 01:42    Scheduled Meds: . aspirin  300 mg Rectal  Daily  . azithromycin  500 mg Intravenous Q24H  . cefTRIAXone (ROCEPHIN)  IV  1 g Intravenous Q24H  . sodium chloride  3 mL Intravenous Q12H   Continuous Infusions: . dextrose 50 mL/hr at 04/08/15 0408   Antibiotics Given (last 72 hours)    Date/Time Action Medication Dose Rate   04/07/15 2005 Given   cefTRIAXone (ROCEPHIN) 1 g in dextrose 5 % 50 mL IVPB - Premix 1 g 100 mL/hr      Principal Problem:   Acute renal failure Active Problems:   Alzheimer's dementia without behavioral disturbance   Iron deficiency anemia   Hypernatremia   Dehydration, severe   Elevated troponin   UTI (lower urinary tract infection)   Acute encephalopathy   Acute renal failure syndrome    Time spent: 35 min    VANN, [facility] Pager [phone]. If 7PM-7AM, please contact night-coverage at [email or web address], password [facility] 04/08/2015, 7:59 AM  LOS: 2 days

## 2015-04-08 NOTE — Clinical Social Work Note (Signed)
Clinical Social Work Assessment  Patient Details  Name: [name] MRN: [id number] Date of Birth: [birth year]  Date of referral:  04/08/15               Reason for consult:  Other (Comment Required), End of Life/Hospice (Admitted from [place])                  Housing/Transportation Living arrangements for the past 2 months:  [place] of Information:  Facility, Other (Comment Required) (Chart Review) Patient Interpreter Needed:  None Criminal Activity/Legal Involvement Pertinent to Current Situation/Hospitalization:  No - Comment as needed Significant Relationships:  None Lives with:  Facility Resident Do you feel safe going back to the place where you live?    Need for family participation in patient care:  Yes (Comment) (MPOA- Neice- [name] - currently a patient at [facility].  Husband Thompson Perry can get messages to her  [phone])  Care giving concerns:  SNF resident who how is comfort care. Facility Francisco accept back with Hospice care if MD feels patient stablizes enough for transport. Family is also open to residential hospice if needed and would request [facility]  Social Worker assessment / plan: Resident of [facility] who was receiving SNF level of care.  Patient's condition has drastically deterioated and he is now a DNR with comfort measures in place.       Employment status:  Retired, Disabled (Comment on whether or not currently receiving Disability) Insurance information:  Managed Care PT Recommendations:  Not assessed at this time Information / Referral to community resources:  [facility], Other (Comment Required) (May possibly need Residential Hospice if condition improves rather than hospital death)  Patient/Family's Response to care:  Patient is not responsive- Francisco open his eyes but does not respond verbally . Not oriented or able to respond to care.  Patient's medical POA is her niece [name]. She is currently a patient at [facility]- however can be reached through her cell phone.  Her husband [name] Francisco also answer/relay calls to her.  Family is aware of patient's poor condition and are agreeable to hospice care if indicated/needed.  No hospice order in place at this time but Francisco assist family and patient as needed should this be indicated.    Patient/Family's Understanding of and Emotional Response to Diagnosis, Current Treatment, and Prognosis: CSW was unable to speak with Medical Power of [name]- Niece [name] due to recent hospitalization- however- her husband stated that Dr. Eliseo Perry had spoken to her this morning and was able to fully explain patient's deteriorating condition and are aware of need for comfort/end of life care.  Patient is unresponsive.  Emotional Assessment Appearance:  Appears stated age Attitude/Demeanor/Rapport:  Lethargic, Other (Not responsive.  Hospital death is anticipated) Affect (typically observed):  Unable to Assess [facility] death is anticipated per MD) Orientation:    Not oriented Alcohol / Substance use:  Not Applicable Psych involvement (Current and /or in the community):  No (Comment)  Discharge Needs  Concerns to be addressed:  Other (Comment Required (End of life support to patient/family as needed) Readmission within the last 30 days:  No Current discharge risk:  Terminally ill Barriers to Discharge:  Other (Probable hospital death per MD)   [name], LCSW 04/08/2015, 4:25 PM  [phone]

## 2015-04-09 DIAGNOSIS — Z515 Encounter for palliative care: Secondary | ICD-10-CM

## 2015-04-09 LAB — URINE CULTURE: Colony Count: >=100000

## 2015-04-09 NOTE — Progress Notes (Signed)
PROGRESS NOTE  [name]:[id number] DOB: Sep 22, 1926 DOA: 04/06/2015 PCP: Tona Sensing, MD  81 yo male adv dementia sent in from snf with worsening dehydration, na level over 160, cr up to 4 from prev nml.. No report of fevers. No n/v/d. Patient cannot provide any history due to chronic dementia. A foley was attempted to be placed at SNF which was traumatic and his penis started bleeding. He was actually sent here because his bleeding would not stop.  Made DNR by MPOA.  Worsened overnight on 4/21. Made comfort care 4/22  Assessment/Plan: Continue comfort measures. Appears comfortable  HPI/Subjective: No new issues reported by nursing staff  Objective: Filed Vitals:   04/08/15 2217  BP: 74/46  Pulse: 78  Temp: 98.6 F (37 C)  Resp: 20    Intake/Output Summary (Last 24 hours) at 04/09/15 1606 Last data filed at 04/09/15 0544  Gross per 24 hour  Intake      0 ml  Output    160 ml  Net   -160 ml   Filed Weights   04/07/15 0300 04/08/15 0406 04/08/15 2217  Weight: 58 kg (127 lb 13.9 oz) 59.2 kg (130 lb 8.2 oz) 61 kg (134 lb 7.7 oz)    Exam:   General:  Eyes open. Will not follow commands or answer questions  Cardiovascular: rrr without murmurs gallops rubs  Respiratory: coarse breath sounds bilaterally  Abdomen: thin, + BS  Musculoskeletal: no edema  Foley with min urine  Data Reviewed: Basic Metabolic Panel:  Recent Labs Lab 04/07/15 0350 04/07/15 0500 04/07/15 1028 04/07/15 1757 04/08/15 0525  NA 168* 166* 162* 163* 161*  K 4.0 4.0 3.8 3.7 3.5  CL 128* 127* 129* 130* 124*  CO2 23 27 24 25 23   GLUCOSE 123* 155* 141* 148* 144*  BUN 93* 94* 90* 86* 75*  CREATININE 3.50* 3.58* 3.30* 3.20* 3.11*  CALCIUM 10.3 9.9 9.8 9.2 9.4  MG  --   --   --   --  2.1   Liver Function Tests:  Recent Labs Lab 04/06/15 1709  AST 51*  ALT 44  ALKPHOS 75  BILITOT 0.9  PROT 8.5*  ALBUMIN 4.3   No results for input(s): LIPASE, AMYLASE in the last  168 hours. No results for input(s): AMMONIA in the last 168 hours. CBC:  Recent Labs Lab 04/06/15 1709 04/06/15 1737 04/07/15 0500 04/08/15 0008  WBC 8.8  --  7.7 8.7  NEUTROABS 4.8  --   --   --   HGB 17.2* 18.4* 12.7* 12.2*  HCT 54.2* 54.0* 41.9 40.0  MCV 100.7*  --  101.0* 101.8*  PLT 90*  --  75* 67*   Cardiac Enzymes:  Recent Labs Lab 04/07/15 0350 04/07/15 1028 04/07/15 1757  TROPONINI 0.11* 0.15* 0.18*   BNP (last 3 results) No results for input(s): BNP in the last 8760 hours.  ProBNP (last 3 results) No results for input(s): PROBNP in the last 8760 hours.  CBG: No results for input(s): GLUCAP in the last 168 hours.  Recent Results (from the past 240 hour(s))  Urine culture     Status: None   Collection Time: 04/06/15  5:48 PM  Result Value Ref Range Status   Specimen Description URINE, CATHETERIZED  Final   Special Requests NONE  Final   Colony Count   Final    >=100,000 COLONIES/ML Performed at [place] Performed at [facility]    Report Status 04/09/2015 FINAL  Final   Organism ID, Bacteria ESCHERICHIA COLI  Final      Susceptibility   Escherichia coli - MIC*    AMPICILLIN <=2 SENSITIVE Sensitive     CEFAZOLIN <=4 SENSITIVE Sensitive     CEFTRIAXONE <=1 SENSITIVE Sensitive     CIPROFLOXACIN <=0.25 SENSITIVE Sensitive     GENTAMICIN <=1 SENSITIVE Sensitive     LEVOFLOXACIN <=0.12 SENSITIVE Sensitive     NITROFURANTOIN <=16 SENSITIVE Sensitive     TOBRAMYCIN <=1 SENSITIVE Sensitive     TRIMETH/SULFA <=20 SENSITIVE Sensitive     PIP/TAZO <=4 SENSITIVE Sensitive     * ESCHERICHIA COLI  MRSA PCR Screening     Status: None   Collection Time: 04/06/15 11:45 PM  Result Value Ref Range Status   MRSA by PCR NEGATIVE NEGATIVE Final    Comment:        The GeneXpert MRSA Assay (FDA approved for NASAL specimens only), is one component of a comprehensive MRSA colonization surveillance  program. It is not intended to diagnose MRSA infection nor to guide or monitor treatment for MRSA infections.      Studies: Dg Chest Port 1 View  04/08/2015   CLINICAL DATA:  Hypoxia  EXAM: PORTABLE CHEST - 1 VIEW  COMPARISON:  04/07/2015  FINDINGS: Diffuse airspace disease throughout the right lung, worsening since prior study. Left lung is clear. Heart is normal size. No effusions. No acute bony abnormality.  IMPRESSION: Diffuse right lung airspace disease, new since prior study. This could represent infection or hemorrhage. Given the normal heart size and asymmetry, edema not likely.   Electronically Signed   By: Rolm Baptise M.D.   On: 04/08/2015 04:26    Scheduled Meds:   Continuous Infusions:   Antibiotics Given (last 72 hours)    Date/Time Action Medication Dose Rate   04/07/15 2005 Given   cefTRIAXone (ROCEPHIN) 1 g in dextrose 5 % 50 mL IVPB - Premix 1 g 100 mL/hr     Time spent: 15 min  Rashanda Magloire L  [facility] Pager [phone], password [facility] 04/09/2015, 4:06 PM  LOS: 3 days

## 2015-04-09 NOTE — Progress Notes (Signed)
Late entry   REceived to room 6e10  ,in no distress.Patient soft spoken and cannot answer questions.Repositioned for comfort.

## 2015-04-10 NOTE — Progress Notes (Signed)
PROGRESS NOTE  [name] DXI:[id number] DOB: [birth year] DOA: 04/06/2015 PCP: Tona Sensing, MD  81 yo male adv dementia sent in from snf with worsening dehydration, na level over 160, cr up to 4 from prev nml.. No report of fevers. No n/v/d. Patient cannot provide any history due to chronic dementia. A foley was attempted to be placed at SNF which was traumatic and his penis started bleeding. He was actually sent here because his bleeding would not stop.  Made DNR by MPOA.  Worsened overnight on 4/21. Made comfort care 4/22  Assessment/Plan: Continue comfort measures. Appears comfortable.  Back to SNF tomorrow if stable for transfer  HPI/Subjective: No new issues reported by nursing staff  Objective: Filed Vitals:   04/09/15 2100  BP: 108/54  Pulse: 76  Temp: 98.8 F (37.1 C)  Resp: 36    Intake/Output Summary (Last 24 hours) at 04/10/15 1435 Last data filed at 04/10/15 0600  Gross per 24 hour  Intake      0 ml  Output    200 ml  Net   -200 ml   Filed Weights   04/08/15 0406 04/08/15 2217 04/09/15 2100  Weight: 59.2 kg (130 lb 8.2 oz) 61 kg (134 lb 7.7 oz) 59.8 kg (131 lb 13.4 oz)    Exam:   General:  Eyes closed unresponsive  Cardiovascular: rrr without murmurs gallops rubs  Respiratory: coarse breath sounds bilaterally  Abdomen: thin, + BS  Musculoskeletal: no edema  Foley with min urine  Data Reviewed: Basic Metabolic Panel:  Recent Labs Lab 04/07/15 0350 04/07/15 0500 04/07/15 1028 04/07/15 1757 04/08/15 0525  NA 168* 166* 162* 163* 161*  K 4.0 4.0 3.8 3.7 3.5  CL 128* 127* 129* 130* 124*  CO2 23 27 24 25 23   GLUCOSE 123* 155* 141* 148* 144*  BUN 93* 94* 90* 86* 75*  CREATININE 3.50* 3.58* 3.30* 3.20* 3.11*  CALCIUM 10.3 9.9 9.8 9.2 9.4  MG  --   --   --   --  2.1   Liver Function Tests:  Recent Labs Lab 04/06/15 1709  AST 51*  ALT 44  ALKPHOS 75  BILITOT 0.9  PROT 8.5*  ALBUMIN 4.3   No results for input(s): LIPASE,  AMYLASE in the last 168 hours. No results for input(s): AMMONIA in the last 168 hours. CBC:  Recent Labs Lab 04/06/15 1709 04/06/15 1737 04/07/15 0500 04/08/15 0008  WBC 8.8  --  7.7 8.7  NEUTROABS 4.8  --   --   --   HGB 17.2* 18.4* 12.7* 12.2*  HCT 54.2* 54.0* 41.9 40.0  MCV 100.7*  --  101.0* 101.8*  PLT 90*  --  75* 67*   Cardiac Enzymes:  Recent Labs Lab 04/07/15 0350 04/07/15 1028 04/07/15 1757  TROPONINI 0.11* 0.15* 0.18*   BNP (last 3 results) No results for input(s): BNP in the last 8760 hours.  ProBNP (last 3 results) No results for input(s): PROBNP in the last 8760 hours.  CBG: No results for input(s): GLUCAP in the last 168 hours.  Recent Results (from the past 240 hour(s))  Urine culture     Status: None   Collection Time: 04/06/15  5:48 PM  Result Value Ref Range Status   Specimen Description URINE, CATHETERIZED  Final   Special Requests NONE  Final   Colony Count   Final    >=100,000 COLONIES/ML Performed at [place] Performed  at [facility]    Report Status 04/09/2015 FINAL  Final   Organism ID, Bacteria ESCHERICHIA COLI  Final      Susceptibility   Escherichia coli - MIC*    AMPICILLIN <=2 SENSITIVE Sensitive     CEFAZOLIN <=4 SENSITIVE Sensitive     CEFTRIAXONE <=1 SENSITIVE Sensitive     CIPROFLOXACIN <=0.25 SENSITIVE Sensitive     GENTAMICIN <=1 SENSITIVE Sensitive     LEVOFLOXACIN <=0.12 SENSITIVE Sensitive     NITROFURANTOIN <=16 SENSITIVE Sensitive     TOBRAMYCIN <=1 SENSITIVE Sensitive     TRIMETH/SULFA <=20 SENSITIVE Sensitive     PIP/TAZO <=4 SENSITIVE Sensitive     * ESCHERICHIA COLI  MRSA PCR Screening     Status: None   Collection Time: 04/06/15 11:45 PM  Result Value Ref Range Status   MRSA by PCR NEGATIVE NEGATIVE Final    Comment:        The GeneXpert MRSA Assay (FDA approved for NASAL specimens only), is one component of a comprehensive MRSA  colonization surveillance program. It is not intended to diagnose MRSA infection nor to guide or monitor treatment for MRSA infections.      Studies: No results found.  Scheduled Meds:   Continuous Infusions:   Antibiotics Given (last 72 hours)    Date/Time Action Medication Dose Rate   04/07/15 2005 Given   cefTRIAXone (ROCEPHIN) 1 g in dextrose 5 % 50 mL IVPB - Premix 1 g 100 mL/hr     Time spent: 15 min  Ellasyn Swilling L  [facility] Pager [phone], password [facility] 04/10/2015, 2:35 PM  LOS: 4 days

## 2015-04-11 MED ORDER — OXYCODONE HCL 20 MG/ML PO CONC: 5.0000 mg | ORAL | Status: DC | PRN

## 2015-04-11 MED ORDER — LORAZEPAM 1 MG PO TABS: 1.0000 mg | ORAL_TABLET | Freq: Four times a day (QID) | ORAL | Status: AC | PRN

## 2015-04-11 NOTE — Clinical Social Work Note (Signed)
Patient discharging back to [facility] today. Discharge information transmitted to facility.  CSW talked with patient's niece [name] regarding discharge and pursuing [facility] once patient back at facility.  Patient will be transported non-emergency ambulance service ([facility]).  [name], MSW, LCSW Licensed Clinical Social Worker [place] [phone]

## 2015-04-11 NOTE — Progress Notes (Signed)
Attempted to give report at [facility] place. Will try again at another time.

## 2015-04-11 NOTE — Progress Notes (Signed)
Remains Stable for transfer back to SNF. Await word from SW whether he may return.  Doree Barthel, MD

## 2015-04-11 NOTE — Discharge Summary (Signed)
Physician Discharge Summary  RENA SWEEDEN BDZ:[id number] DOB: 01-05-1926 DOA: 04/06/2015  PCP: Tona Sensing, MD  Admit date: 04/06/2015 Discharge date: 04/11/2015  Time spent: greater than 30 minutes  Recommendations for Outpatient Follow-up:  Back to SNF for end of life care Hospice to continue to follow  Discharge Diagnoses:  Principal Problem:   Acute renal failure Active Problems:   Alzheimer's dementia without behavioral disturbance   Iron deficiency anemia   Hypernatremia   Dehydration, severe   Elevated troponin   UTI (lower urinary tract infection)   Acute encephalopathy   Acute renal failure syndrome   Palliative care encounter severe protein calorie malnutrition  Discharge Condition: Stable for end-of-life care  Diet recommendation: Comfort feeds as tolerated  CODE STATUS: DO NOT RESUSCITATE, hospice, do not rehospitalize  Filed Weights   04/08/15 0406 04/08/15 2217 04/09/15 2100  Weight: 59.2 kg (130 lb 8.2 oz) 61 kg (134 lb 7.7 oz) 59.8 kg (131 lb 13.4 oz)    History of present illness/Hospital Course:  81 yo male adv dementia sent in from snf with worsening dehydration, na level over 160, cr up to 4 from prev nml.. No report of fevers. No n/v/d. Patient cannot provide any history due to chronic dementia. A foley was attempted to be placed at SNF which was traumatic and his penis started bleeding. He was actually sent here because his bleeding would not stop. Made DNR by [facility]. Worsened overnight on 4/21 and made comfort care.  Acute renal failure- initially hydrated, but once made comfort care, IV fluids stopped. -making minimal urine Advanced Alzheimer's dementia without behavioral disturbance:  Now comfort care. Has stabilized to the point where he may go back to skilled nursing facility with hospice.   Hypernatremia- secondary to dehydration. Initially it on hypotonic IV fluids.  Dehydration, severe- As above. Not eating here but may have  comfort feeds if desired.  Elevated troponin- Serial trop, likely due to decline in renal function  UTI (lower urinary tract infection)- initially started on antibiotics, but all but comfort measures have been stopped.  Procedures:  None  Consultations:  None  Discharge Exam: Filed Vitals:   04/11/15 1257  BP: 111/95  Pulse: 58  Temp: 97.5 F (36.4 C)  Resp: 29    General: Awake. Nonverbal. Does not follow commands. Cardiovascular: Regular rate rhythm without murmurs gallops rubs Respiratory: Clear to auscultation bilaterally without wheezes rhonchi or rales Extremities no clubbing cyanosis or edema  Discharge Instructions   Discharge Instructions    Bed rest    Complete by:  As directed           Current Discharge Medication List    START taking these medications   Details  LORazepam (ATIVAN) 1 MG tablet Place 1 tablet (1 mg total) under the tongue every 6 (six) hours as needed (agitation). Qty: 10 tablet, Refills: 0    oxyCODONE (ROXICODONE INTENSOL) 20 MG/ML concentrated solution Take 0.3 mLs (6 mg total) by mouth every 4 (four) hours as needed for severe pain. Qty: 30 mL, Refills: 0      STOP taking these medications     brimonidine-timolol (COMBIGAN) 0.2-0.5 % ophthalmic solution      calcium-vitamin D (OSCAL WITH D) 500-200 MG-UNIT per tablet      Cholecalciferol (VITAMIN D PO)      donepezil (ARICEPT) 10 MG tablet      Ferrous Sulfate (IRON SUPPLEMENT PO)      HYDROcodone-acetaminophen (NORCO/VICODIN) 5-325 MG per tablet  lactulose (CHRONULAC) 10 GM/15ML solution      Memantine HCl ER (NAMENDA XR) 28 MG CP24      polyethylene glycol (MIRALAX / GLYCOLAX) packet      pregabalin (LYRICA) 50 MG capsule      senna (SENOKOT) 8.6 MG tablet      travoprost, benzalkonium, (TRAVATAN) 0.004 % ophthalmic solution      vitamin C (ASCORBIC ACID) 500 MG tablet        No Known Allergies Follow-up Information    Follow up with  PATEL,BHAIRAVI, MD.   Specialty:  Internal Medicine   Why:  As needed   Contact information:   [facility] [place] [address] [phone]        The results of significant diagnostics from this hospitalization (including imaging, microbiology, ancillary and laboratory) are listed below for reference.    Significant Diagnostic Studies: Ct Abdomen Pelvis Wo Contrast  04/06/2015   CLINICAL DATA:  Acute renal failure and bleeding from penis during Foley catheter placement  EXAM: CT ABDOMEN AND PELVIS WITHOUT CONTRAST  TECHNIQUE: Multidetector CT imaging of the abdomen and pelvis was performed following the standard protocol without IV contrast.  COMPARISON:  None.  FINDINGS: Lung bases demonstrate very minimal atelectasis in the right lower lobe.  The liver, gallbladder, spleen, adrenal glands and pancreas are within normal limits. The kidneys are well visualized bilaterally. Right renal pelvic calculi are noted without obstructive change. The right ureter appears within normal limits. The bladder is decompressed by Foley catheter. Bilateral renal cystic change is noted.  Aortoiliac calcifications are seen without aneurysmal dilatation. The appendix is within normal limits. Diverticular change is noted without diverticulitis.  IMPRESSION: Right renal pelvic calculi without obstructive change.  Bilateral renal cysts.  Diverticulosis without diverticulitis.  No other focal abnormality is seen.   Electronically Signed   By: Inez Catalina M.D.   On: 04/06/2015 21:04   Dg Chest 1 View  04/06/2015   CLINICAL DATA:  Acute renal failure  EXAM: CHEST  1 VIEW  COMPARISON:  03/24/2011  FINDINGS: The heart size and mediastinal contours are within normal limits. Both lungs are clear. The visualized skeletal structures are unremarkable.  IMPRESSION: No active disease.   Electronically Signed   By: Inez Catalina M.D.   On: 04/06/2015 16:39   Ct Head Wo Contrast  04/06/2015   CLINICAL DATA:   Acute renal failure. Initially unresponsive. Altered mental status.  EXAM: CT HEAD WITHOUT CONTRAST  TECHNIQUE: Contiguous axial images were obtained from the base of the skull through the vertex without intravenous contrast.  COMPARISON:  02/20/2007  FINDINGS: The brain shows generalized atrophy. There chronic small-vessel ischemic changes of the deep white matter. No sign of acute infarction, mass lesion, hemorrhage, hydrocephalus or extra-axial collection. Physiologic calcification of the basal ganglia. Prominent dural calcification. Neither is significant. There is atherosclerotic calcification of the major vessels at the base of the brain.  IMPRESSION: Generalized atrophy. Chronic small vessel disease. No acute or reversible finding.   Electronically Signed   By: Nelson Chimes M.D.   On: 04/06/2015 21:00   Dg Chest Port 1 View  04/08/2015   CLINICAL DATA:  Hypoxia  EXAM: PORTABLE CHEST - 1 VIEW  COMPARISON:  04/07/2015  FINDINGS: Diffuse airspace disease throughout the right lung, worsening since prior study. Left lung is clear. Heart is normal size. No effusions. No acute bony abnormality.  IMPRESSION: Diffuse right lung airspace disease, new since prior study. This could  represent infection or hemorrhage. Given the normal heart size and asymmetry, edema not likely.   Electronically Signed   By: Rolm Baptise M.D.   On: 04/08/2015 04:26   Dg Chest Port 1 View  04/07/2015   CLINICAL DATA:  Decreased oxygen saturation  EXAM: PORTABLE CHEST - 1 VIEW  COMPARISON:  04/06/2015  FINDINGS: No cardiomegaly. Mild dextrocardia. Aortic and hilar contours are further distorted by rightward rotation.  Lucent appearing lungs which could reflect emphysematous change. There is no edema, consolidation, effusion, or pneumothorax. No acute osseous findings.  IMPRESSION: No active disease.   Electronically Signed   By: Monte Fantasia M.D.   On: 04/07/2015 01:42    Microbiology: Recent Results (from the past 240 hour(s))   Urine culture     Status: None   Collection Time: 04/06/15  5:48 PM  Result Value Ref Range Status   Specimen Description URINE, CATHETERIZED  Final   Special Requests NONE  Final   Colony Count   Final    >=100,000 COLONIES/ML Performed at [facility]    Culture   Final    ESCHERICHIA COLI Performed at [facility]    Report Status 04/09/2015 FINAL  Final   Organism ID, Bacteria ESCHERICHIA COLI  Final      Susceptibility   Escherichia coli - MIC*    AMPICILLIN <=2 SENSITIVE Sensitive     CEFAZOLIN <=4 SENSITIVE Sensitive     CEFTRIAXONE <=1 SENSITIVE Sensitive     CIPROFLOXACIN <=0.25 SENSITIVE Sensitive     GENTAMICIN <=1 SENSITIVE Sensitive     LEVOFLOXACIN <=0.12 SENSITIVE Sensitive     NITROFURANTOIN <=16 SENSITIVE Sensitive     TOBRAMYCIN <=1 SENSITIVE Sensitive     TRIMETH/SULFA <=20 SENSITIVE Sensitive     PIP/TAZO <=4 SENSITIVE Sensitive     * ESCHERICHIA COLI  MRSA PCR Screening     Status: None   Collection Time: 04/06/15 11:45 PM  Result Value Ref Range Status   MRSA by PCR NEGATIVE NEGATIVE Final    Comment:        The GeneXpert MRSA Assay (FDA approved for NASAL specimens only), is one component of a comprehensive MRSA colonization surveillance program. It is not intended to diagnose MRSA infection nor to guide or monitor treatment for MRSA infections.      Labs: Basic Metabolic Panel:  Recent Labs Lab 04/07/15 0350 04/07/15 0500 04/07/15 1028 04/07/15 1757 04/08/15 0525  NA 168* 166* 162* 163* 161*  K 4.0 4.0 3.8 3.7 3.5  CL 128* 127* 129* 130* 124*  CO2 23 27 24 25 23   GLUCOSE 123* 155* 141* 148* 144*  BUN 93* 94* 90* 86* 75*  CREATININE 3.50* 3.58* 3.30* 3.20* 3.11*  CALCIUM 10.3 9.9 9.8 9.2 9.4  MG  --   --   --   --  2.1   Liver Function Tests:  Recent Labs Lab 04/06/15 1709  AST 51*  ALT 44  ALKPHOS 75  BILITOT 0.9  PROT 8.5*  ALBUMIN 4.3   No results for input(s): LIPASE, AMYLASE in the last 168  hours. No results for input(s): AMMONIA in the last 168 hours. CBC:  Recent Labs Lab 04/06/15 1709 04/06/15 1737 04/07/15 0500 04/08/15 0008  WBC 8.8  --  7.7 8.7  NEUTROABS 4.8  --   --   --   HGB 17.2* 18.4* 12.7* 12.2*  HCT 54.2* 54.0* 41.9 40.0  MCV 100.7*  --  101.0* 101.8*  PLT 90*  --  75* 67*   Cardiac Enzymes:  Recent Labs Lab 04/07/15 0350 04/07/15 1028 04/07/15 1757  TROPONINI 0.11* 0.15* 0.18*   BNP: BNP (last 3 results) No results for input(s): BNP in the last 8760 hours.  ProBNP (last 3 results) No results for input(s): PROBNP in the last 8760 hours.  CBG: No results for input(s): GLUCAP in the last 168 hours.     SignedDelfina Redwood  [facility] 04/11/2015, 3:09 PM

## 2015-04-11 NOTE — Progress Notes (Signed)
Report called to [facility], will call niece when transport arrives Prentiss Bells [phone].

## 2015-04-12 ENCOUNTER — Non-Acute Institutional Stay (SKILLED_NURSING_FACILITY): Payer: Medicare Other | Admitting: Registered Nurse

## 2015-04-12 DIAGNOSIS — Z515 Encounter for palliative care: Secondary | ICD-10-CM

## 2015-04-12 NOTE — Progress Notes (Signed)
Patient ID: [name], male   DOB: [birth year], 81 y.o.   MRN: [id number]   Place of Service: [facility]  No Known Allergies  Code Status: DNR  Goals of Care: Comfort and Quality of Life/Hospice  Chief Complaint  Patient presents with  . Acute Visit    transfer of care/end of life    HPI  81 y.o. male with PMH of dementia, anemia, FTT, prostate cancer, chronic lung disease among others is being seen for an acute visit for transfer of care. Patient has been admitted to Hospice service from [facility] after returning from recent hospitalization for hypernatremia and acute renal failure secondary to severe dehydration and penile bleeding from catheter insertion. Seen in room today. Family at bedside.   Review of Systems Unable to obtain due to dementia and end of life status   Past Medical History  Diagnosis Date  . Vitamin D deficiency   . Chronic lung disease   . Thrombocytopenia   . Anemia   . Dementia   . Glaucoma     chronic angle closure  . Cataract   . Osteoporosis   . Weight loss   . Ulcer     Pressure Ulcers on various body areas  . Alzheimer's disease   . Unspecified constipation   . Incomplete bladder emptying   . Injury to tibial vessel(s), unspecified   . Unspecified vitamin B deficiency   . Pressure ulcer, unspecified site(707.00)   . Depressive disorder, not elsewhere classified   . Unspecified cataract   . Malignant neoplasm of prostate   . Impotence of organic origin   . Alzheimer's disease   . Chronic kidney disease, unspecified   . PAD (peripheral artery disease) 2012    Past Surgical History  Procedure Laterality Date  . Eye surgery  cataract surgery bilateral   2012  . Peripheral vascular catheterization  08-23-2011    R-RA >50%, tortuous L iliac, L-SFA, L-pop diseased, L-PT & L-ant tibial 100%, diseased L peroneal & R peroneal    History   Social History  . Marital Status: Widowed    Spouse Name: N/A  . Number of  Children: N/A  . Years of Education: N/A   Occupational History  . Not on file.   Social History Main Topics  . Smoking status: Former Smoker    Quit date: 12/17/2008  . Smokeless tobacco: Not on file  . Alcohol Use: Yes     Comment: Lifelong alcoholic until [age]  . Drug Use: No  . Sexual Activity: Not on file   Other Topics Concern  . Not on file   Social History Narrative      Medication List       This list is accurate as of: 04/12/15  2:45 PM.  Always use your most recent med list.               LORazepam 1 MG tablet  Commonly known as:  ATIVAN  Place 1 tablet (1 mg total) under the tongue every 6 (six) hours as needed (agitation).     morphine 20 MG/ML concentrated solution  Commonly known as:  ROXANOL  Take 5 mg by mouth every 2 (two) hours as needed for moderate pain, severe pain or shortness of breath.        Physical Exam  There were no vitals taken for this visit.: Unable to obtain VS  Constitutional: Frail/cachetic elderly male in moderate distress.   Cardiac: Normal S1, S2.  Slightly tachycardic without appreciable murmurs, rubs, or gallops. Distal pulses weak/thready  Respiratory: Labored respiration-tachypneic R 40s per minute. Breath sounds coarse bilaterally with rhonchi at bases GI: Audible bowel sounds in all quadrants.  Musculoskeletal: bed bound.  Skin: cold and dry Neurological: Stupor  Labs Reviewed  CBC Latest Ref Rng 04/08/2015 04/07/2015 04/06/2015  WBC 4.0 - 10.5 K/uL 8.7 7.7 -  Hemoglobin 13.0 - 17.0 g/dL 12.2(L) 12.7(L) 18.4(H)  Hematocrit 39.0 - 52.0 % 40.0 41.9 54.0(H)  Platelets 150 - 400 K/uL 67(L) 75(L) -     CMP Latest Ref Rng 04/08/2015 04/07/2015 04/07/2015  Glucose 70 - 99 mg/dL 144(H) 148(H) 141(H)  BUN 6 - 23 mg/dL 75(H) 86(H) 90(H)  Creatinine 0.50 - 1.35 mg/dL 3.11(H) 3.20(H) 3.30(H)  Sodium 135 - 145 mmol/L 161(HH) 163(HH) 162(HH)  Potassium 3.5 - 5.1 mmol/L 3.5 3.7 3.8  Chloride 96 - 112 mmol/L 124(H) 130(H)  129(H)  CO2 19 - 32 mmol/L 23 25 24   Calcium 8.4 - 10.5 mg/dL 9.4 9.2 9.8  Total Protein 6.0 - 8.3 g/dL - - -  Total Bilirubin 0.3 - 1.2 mg/dL - - -  Alkaline Phos 39 - 117 U/L - - -  AST 0 - 37 U/L - - -  ALT 0 - 53 U/L - - -   Assessment & Plan 1. End of life care Goal of care is comfort only. Start roxanol 5mg  every 2 hours as needed for pain/shob. Continue ativan 1mg  SL every six hours as needed for anxiety.   Family/Staff Communication Plan of care discussed with family, hospice RN, and nursing staff. Family, hospice RN, and nursing staff verbalized understanding and agree with plan of care. No additional questions or concerns reported.    Arthur Holms, MSN, AGNP-C [facility] [address] [place], [place] [address] [phone] [8am-5pm] After hours: [phone]

## 2015-04-17 DEATH — deceased

## 2015-05-20 IMAGING — CT CT ABD-PELV W/O CM
2 of 4 series · 16 of 46 positions shown, 18 images · non-contrast
Comparison: None.

CLINICAL DATA: Acute renal failure and bleeding from penis during
Foley catheter placement

EXAM:
CT ABDOMEN AND PELVIS WITHOUT CONTRAST
TECHNIQUE: Multidetector CT imaging of the abdomen and pelvis was performed
following the standard protocol without IV contrast.

[Series 2: abd/ pelvis 5.0 i30f 1 · axial · 0.71mm/px · z∈[-456,-31]mm · 13 of 93 slices shown, 15 images]
[im 4/93  soft-tissue]
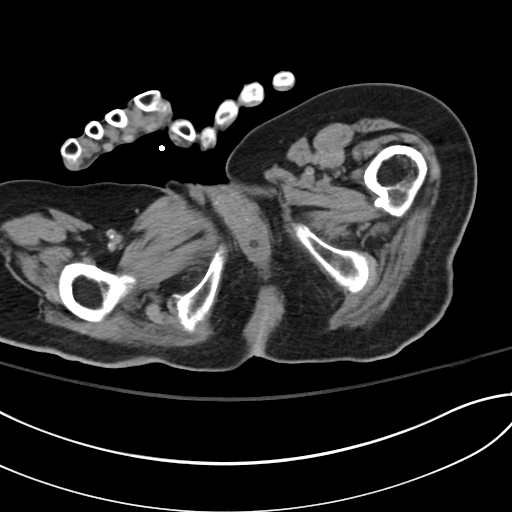
[im 4/93  bone]
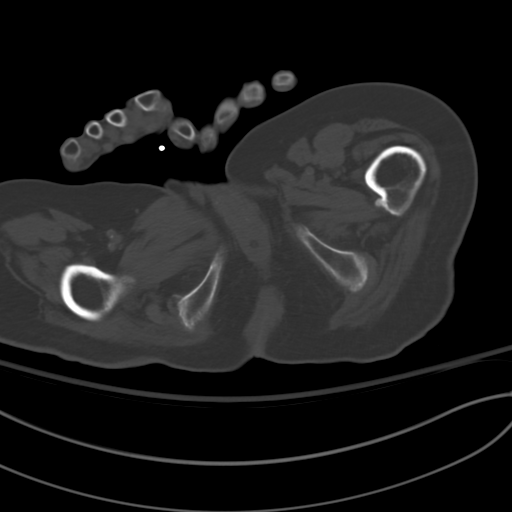
[im 12/93  soft-tissue]
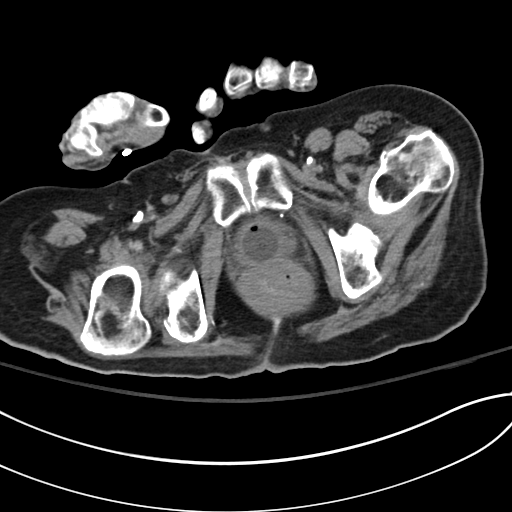
[im 19/93  soft-tissue]
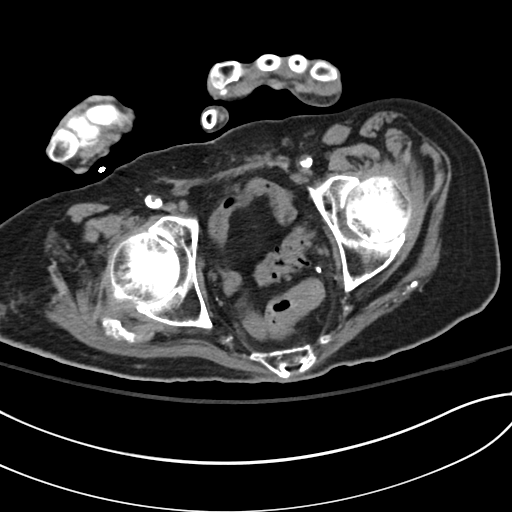
[im 26/93  soft-tissue]
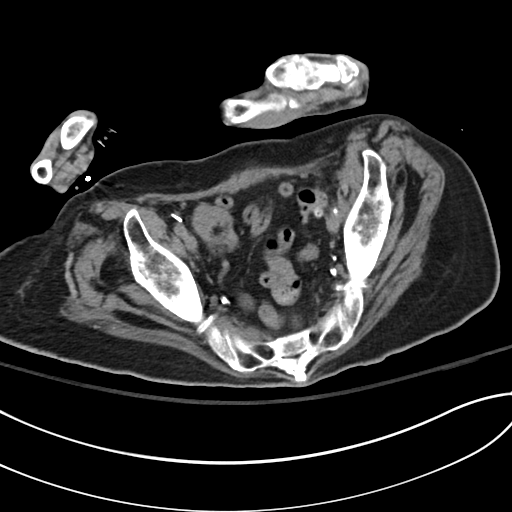
[im 34/93  soft-tissue]
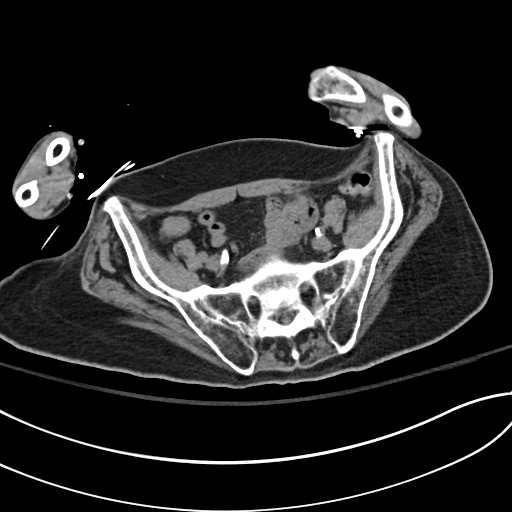
[im 41/93  soft-tissue]
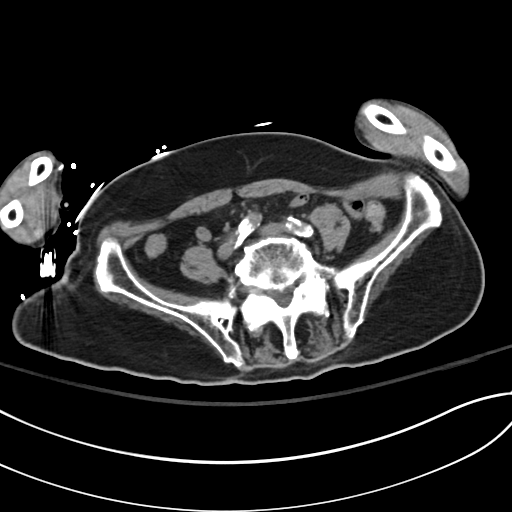
[im 48/93  soft-tissue]
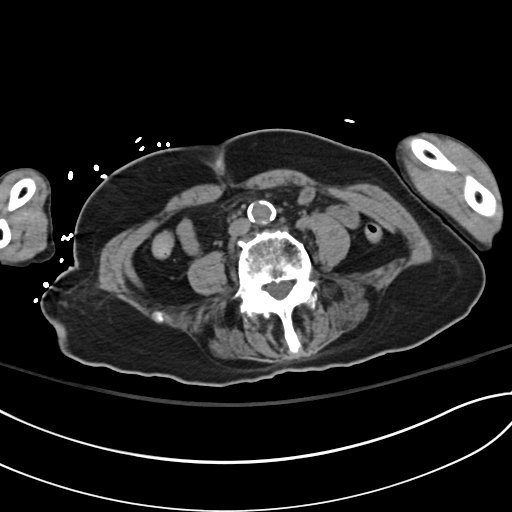
[im 52/93  soft-tissue]
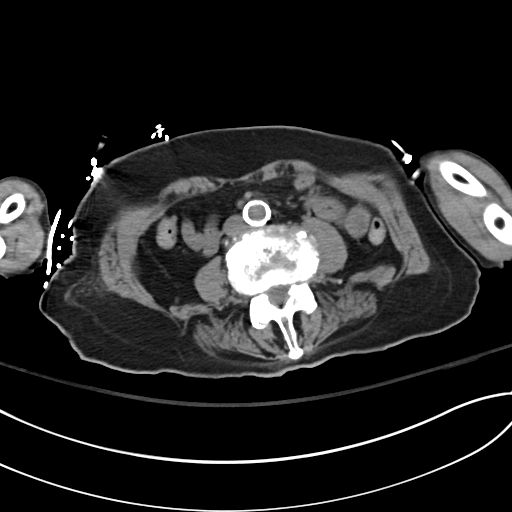
[im 59/93  soft-tissue]
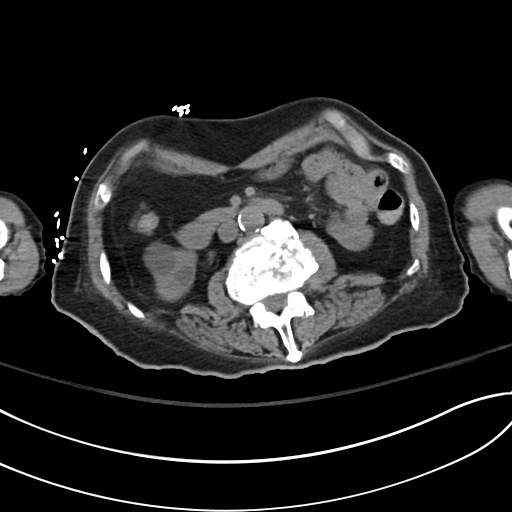
[im 59/93  bone]
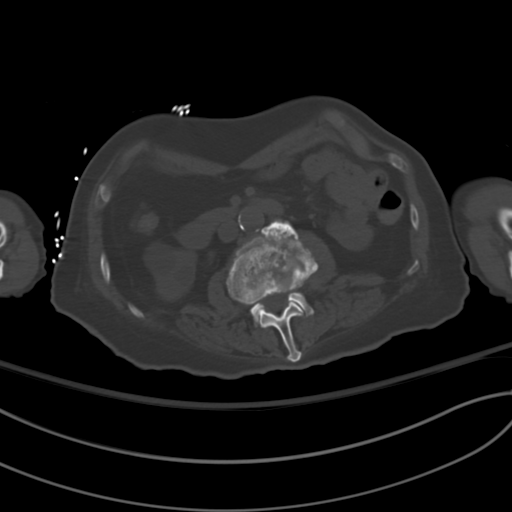
[im 67/93  soft-tissue]
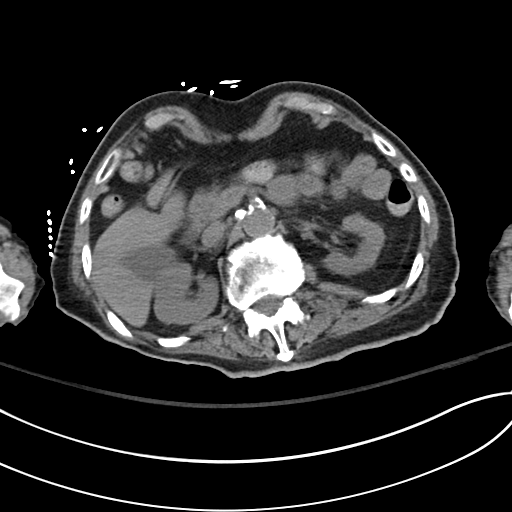
[im 74/93  soft-tissue]
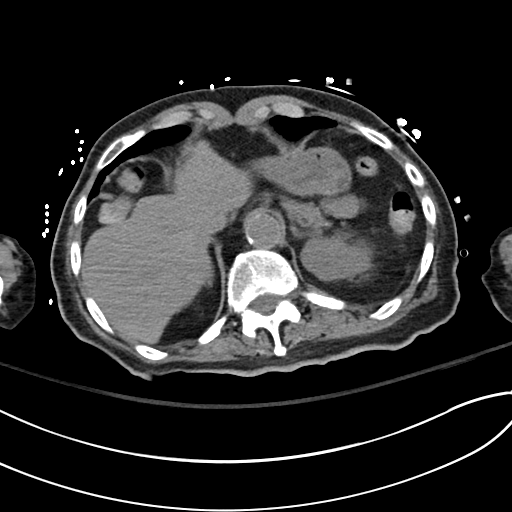
[im 81/93  soft-tissue]
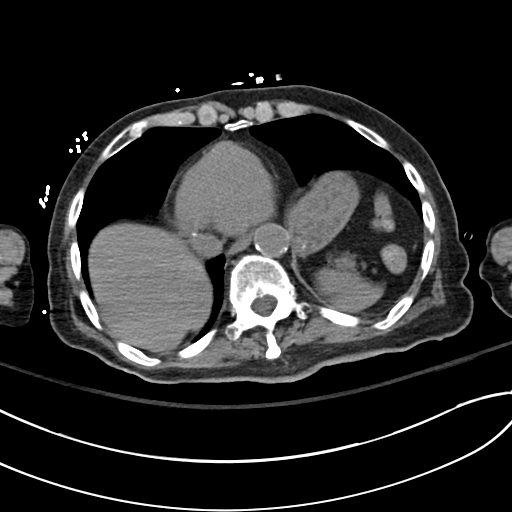
[im 89/93  soft-tissue]
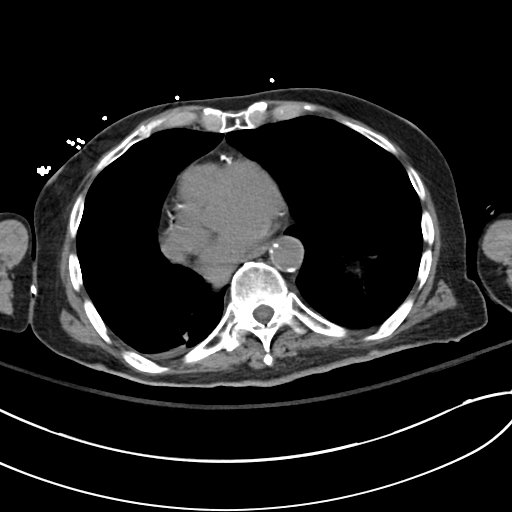

[Series 5: coronals · coronal · 0.70mm/px · 3 of 118 slices shown]
[im 40/118  soft-tissue]
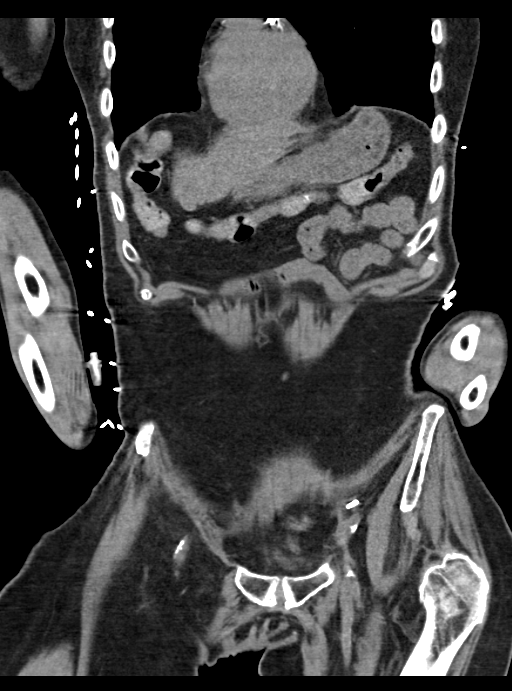
[im 53/118  soft-tissue]
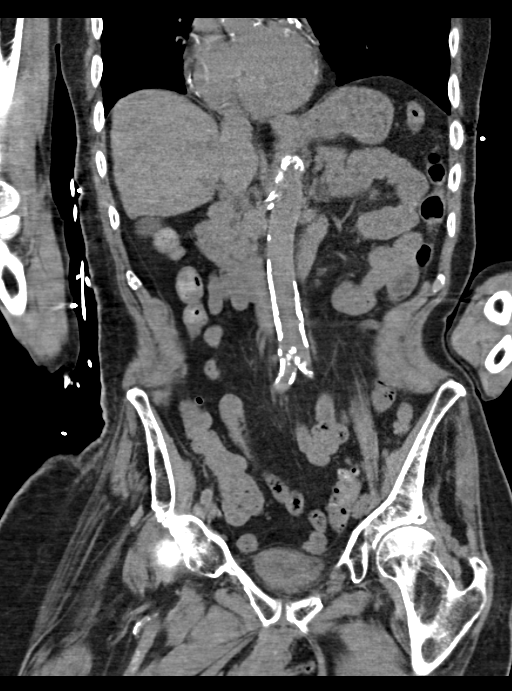
[im 66/118  soft-tissue]
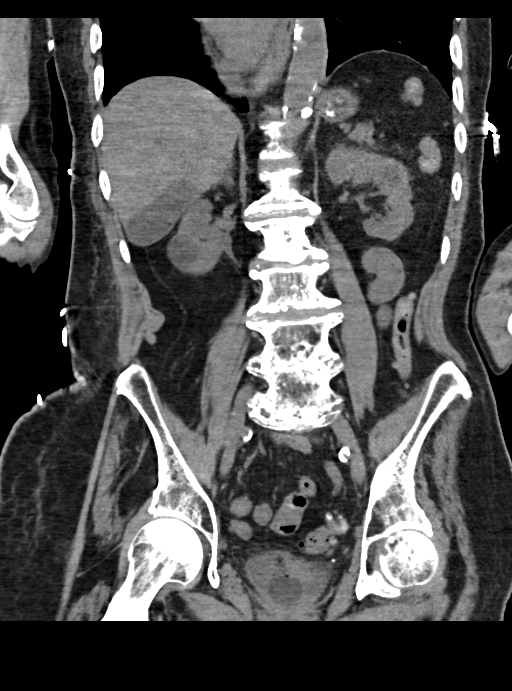

[16 of 46 positions shown; findings below may reference images not displayed]

FINDINGS: Lung bases demonstrate very minimal atelectasis in the right lower
lobe.

The liver, gallbladder, spleen, adrenal glands and pancreas are
within normal limits. The kidneys are well visualized bilaterally.
Right renal pelvic calculi are noted without obstructive change. The
right ureter appears within normal limits. The bladder is
decompressed by Foley catheter. Bilateral renal cystic change is
noted.

Aortoiliac calcifications are seen without aneurysmal dilatation.
The appendix is within normal limits. Diverticular change is noted
without diverticulitis.
IMPRESSION: Right renal pelvic calculi without obstructive change.

Bilateral renal cysts.

Diverticulosis without diverticulitis.

No other focal abnormality is seen.

## 2015-05-20 IMAGING — CR DG CHEST 1V
1 series · 1 of 1 positions shown · non-contrast
Comparison: 03/24/2011

CLINICAL DATA: Acute renal failure

EXAM:
CHEST  1 VIEW

[chest pa]
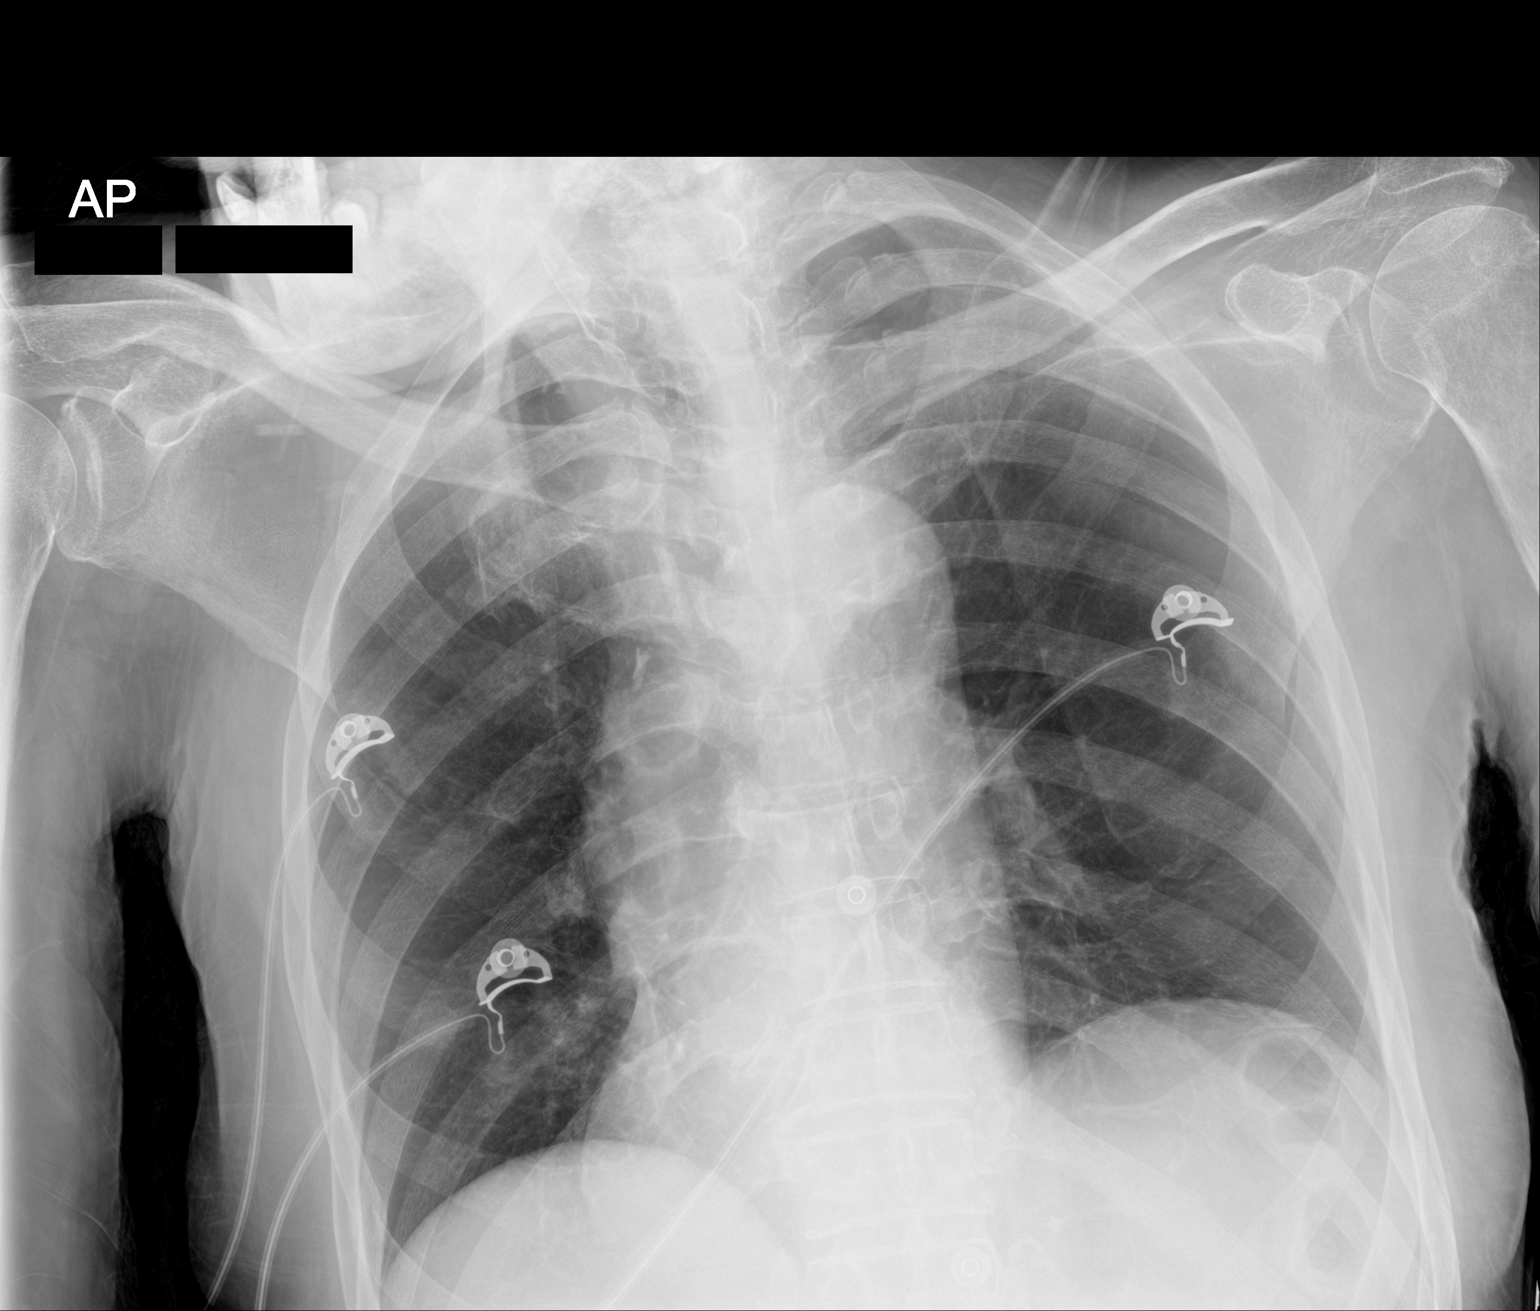

[1 of 1 positions shown; findings below may reference images not displayed]

FINDINGS: The heart size and mediastinal contours are within normal limits.
Both lungs are clear. The visualized skeletal structures are
unremarkable.
IMPRESSION: No active disease.

## 2015-05-21 IMAGING — CR DG CHEST 1V PORT
1 series · 1 of 1 positions shown · non-contrast
Comparison: 04/06/2015

CLINICAL DATA: Decreased oxygen saturation

EXAM:
PORTABLE CHEST - 1 VIEW

[AP]
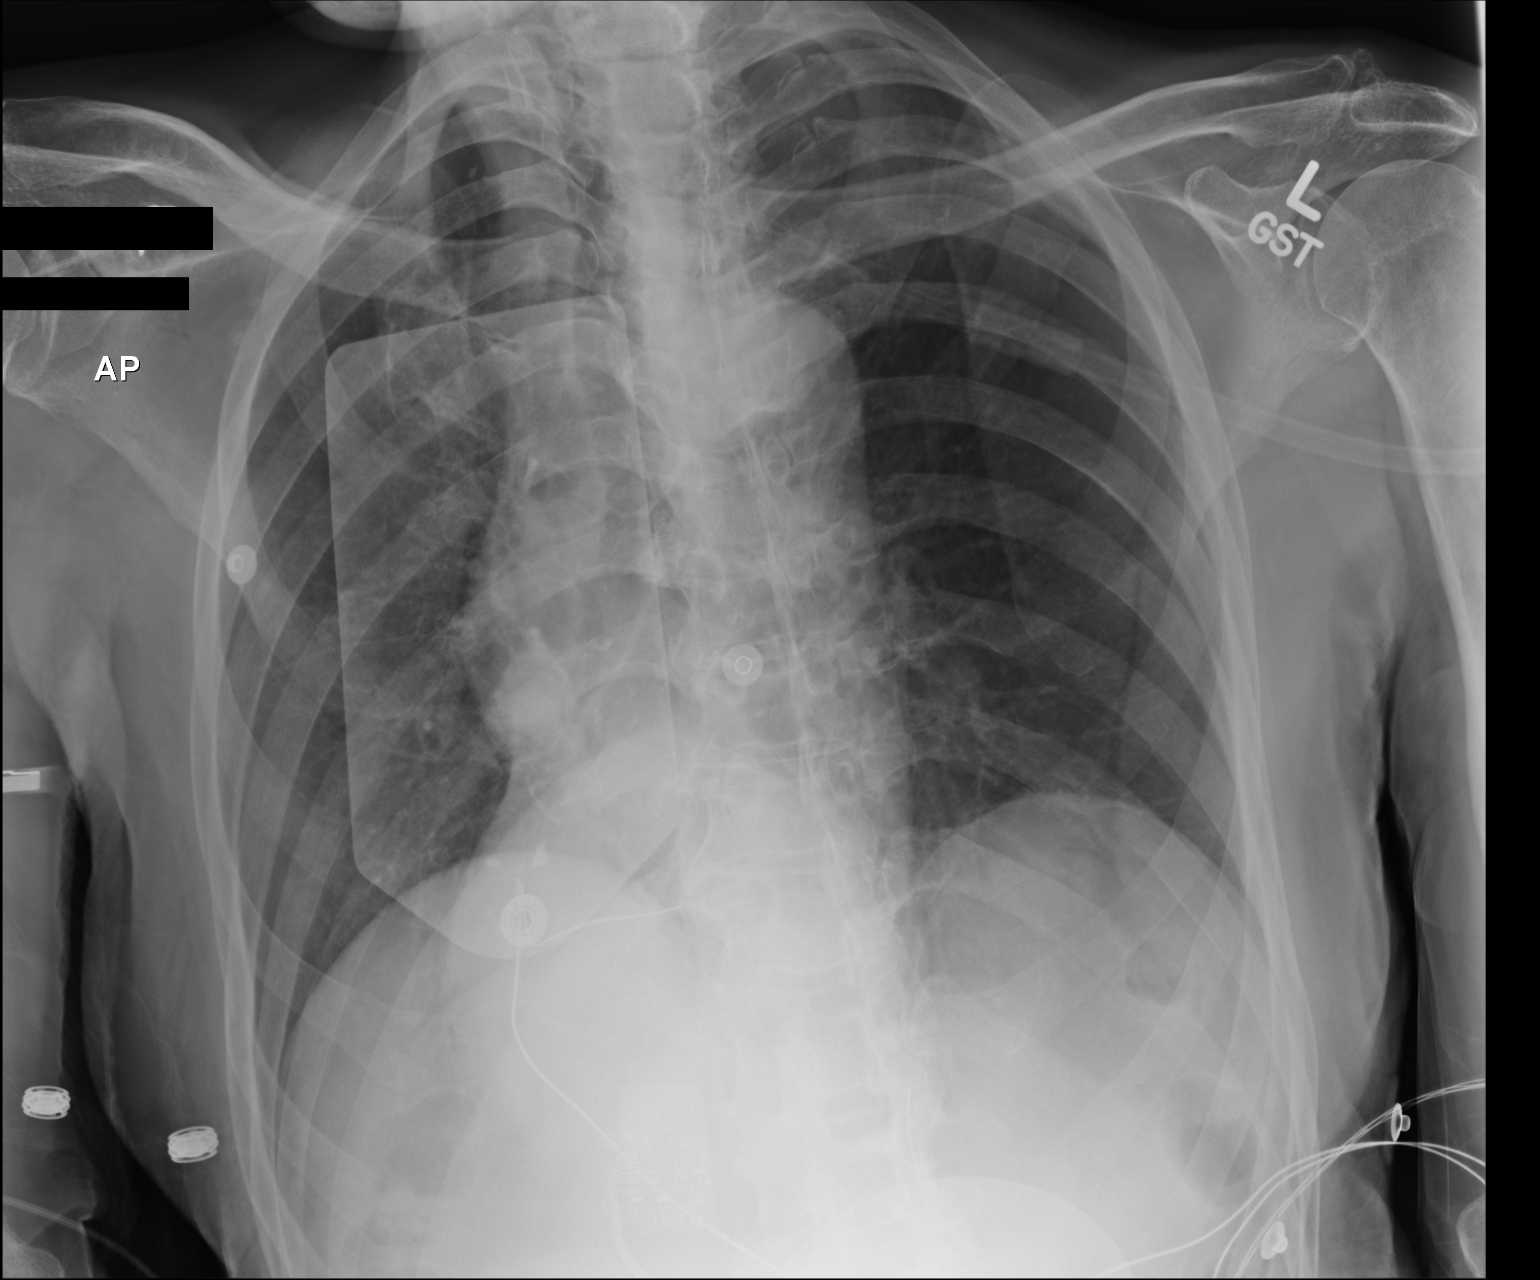

[1 of 1 positions shown; findings below may reference images not displayed]

FINDINGS: No cardiomegaly. Mild dextrocardia. Aortic and hilar contours are
further distorted by rightward rotation.

Lucent appearing lungs which could reflect emphysematous change.
There is no edema, consolidation, effusion, or pneumothorax. No
acute osseous findings.
IMPRESSION: No active disease.
